# Patient Record
Sex: Female | Born: 1935 | Race: White | Hispanic: No | Marital: Married
Health system: Midwestern US, Community
[De-identification: ages and names within clinical notes are randomized; demographics above are authoritative.]

## PROBLEM LIST (undated history)

## (undated) DIAGNOSIS — R600 Localized edema: Secondary | ICD-10-CM

## (undated) DIAGNOSIS — N1832 Chronic kidney disease, stage 3b (HCC): Secondary | ICD-10-CM

## (undated) DIAGNOSIS — I129 Hypertensive chronic kidney disease with stage 1 through stage 4 chronic kidney disease, or unspecified chronic kidney disease: Secondary | ICD-10-CM

---

## 2010-02-18 LAB — CBC WITH AUTO DIFFERENTIAL
Basophils %: 0.5 % (ref 0.0–2.0)
Basophils Absolute: 0 10*3 (ref 0.0–0.2)
Eosinophils %: 1.4 % (ref 0.0–5.0)
Eosinophils Absolute: 0.1 10*3 (ref 0.0–0.6)
Granulocyte Absolute Count: 1.7 10*3 (ref 1.7–7.7)
Hematocrit: 35.9 % — ABNORMAL LOW (ref 36.0–48.0)
Hemoglobin: 12.4 g/dL (ref 12.0–16.0)
Lymphocytes %: 43.4 % — ABNORMAL HIGH (ref 25.0–40.0)
Lymphocytes Absolute: 1.6 10*3 (ref 1.0–5.1)
MCH: 32.1 pg (ref 26–34)
MCHC: 34.4 g/dL (ref 31–36)
MCV: 93.2 fl (ref 80–100)
MPV: 9.1 fl (ref 5.0–10.5)
Monocytes %: 9.7 %
Monocytes Absolute: 0.4 10*3
Platelets: 229 10*3 (ref 135–450)
RBC: 3.85 10*6 — ABNORMAL LOW (ref 4.0–5.2)
RDW: 13.6 % (ref 11.5–14.5)
Segs Relative: 45 % (ref 42.0–63.0)
WBC: 3.8 10*3 — ABNORMAL LOW (ref 4.0–11.0)

## 2010-02-18 LAB — COMPREHENSIVE METABOLIC PANEL
ALT: 19 U/L (ref 10–40)
AST: 28 U/L (ref 15–37)
Albumin/Globulin Ratio: 1.6 (ref 1.1–2.2)
Albumin: 4.4 g/dL (ref 3.4–5.0)
Alkaline Phosphatase: 61 U/L (ref 45–129)
BUN: 19 mg/dL — ABNORMAL HIGH (ref 7–18)
CO2: 29 meq/L (ref 21–32)
Calcium: 9.9 mg/dL (ref 8.3–10.6)
Chloride: 105 meq/L (ref 99–110)
Creatinine: 1 mg/dL (ref 0.6–1.2)
GFR Est, African/Amer: >60
GFR, Estimated: 57 — ABNORMAL LOW (ref >60)
Glucose: 82 mg/dL (ref 70–99)
Potassium: 5.2 meq/L — ABNORMAL HIGH (ref 3.5–5.1)
Sodium: 139 meq/L (ref 136–145)
Total Bilirubin: 0.3 mg/dL (ref 0.0–1.0)
Total Protein: 7.2 g/dL (ref 6.4–8.2)

## 2010-02-18 LAB — LIPID PANEL
Cholesterol, Total: 193 mg/dl (ref <200)
HDL: 78 mg/dL — ABNORMAL HIGH (ref 40–60)
LDL Calculated: 103 mg/dl — ABNORMAL HIGH (ref <100)
Triglycerides: 59 mg/dl (ref <150)
VLDL Cholesterol Calculated: 12 mg/dl

## 2010-02-19 LAB — TSH
T4 Free: 1.14 ng/dL (ref 0.9–1.8)
TSH: 4.66 u[IU]/mL (ref 0.35–5.5)

## 2010-02-19 LAB — VITAMIN D 25 HYDROXY: Vit D, 25-Hydroxy: 31 ng/mL (ref 30–80)

## 2010-02-19 LAB — T4, FREE: T4 Free: 1.14 ng/dL (ref 0.9–1.8)

## 2012-02-22 NOTE — Telephone Encounter (Signed)
NEEDS REFILL ON CRESTOR 10MG  CALLED TO [facility]

## 2012-02-22 NOTE — Telephone Encounter (Signed)
No refill ; not sreen recently ; appt in 4-6 wk if she wants on ; not sooner

## 2012-02-23 NOTE — Telephone Encounter (Signed)
Tried to call the patient but no answer.  Will try back later

## 2014-09-25 LAB — COMPREHENSIVE METABOLIC PANEL
ALT: 30 U/L (ref 10–40)
AST: 31 IU/L (ref 15–37)
Albumin: 3.5 GM/DL (ref 3.4–5.0)
Alkaline Phosphatase: 82 IU/L (ref 40–128)
Anion Gap: 11 (ref 4–16)
BUN: 42 MG/DL — ABNORMAL HIGH (ref 6–23)
CO2: 26 MMOL/L (ref 21–32)
Calcium: 8.9 MG/DL (ref 8.3–10.6)
Chloride: 103 mMol/L (ref 99–110)
Creatinine: 1.2 MG/DL — ABNORMAL HIGH (ref 0.6–1.1)
GFR African American: 52 mL/min/{1.73_m2}
GFR Non-African American: 43 mL/min/{1.73_m2}
Glucose, Fasting: 115 MG/DL — ABNORMAL HIGH (ref 70–99)
Potassium: 4.7 MMOL/L (ref 3.5–5.1)
Sodium: 140 MMOL/L (ref 136–145)
Total Bilirubin: 0.2 MG/DL (ref 0.0–1.0)
Total Protein: 5.9 GM/DL — ABNORMAL LOW (ref 6.4–8.2)

## 2014-09-25 LAB — CBC WITH AUTO DIFFERENTIAL
Basophils %: 0.3 % (ref 0–1)
Basophils Absolute: 0 10*3/uL
Eosinophils %: 3.7 % — ABNORMAL HIGH (ref 0–3)
Eosinophils Absolute: 0.3 10*3/uL
Hematocrit: 36.3 % — ABNORMAL LOW (ref 37–47)
Hemoglobin: 11.9 GM/DL — ABNORMAL LOW (ref 12.5–16.0)
Lymphocytes %: 19.5 % — ABNORMAL LOW (ref 24–44)
Lymphocytes Absolute: 1.3 10*3/uL
MCH: 30.3 PG (ref 27–31)
MCHC: 32.7 % (ref 32.0–36.0)
MCV: 92.5 FL (ref 78–100)
MPV: 9.8 FL (ref 7.5–11.1)
Monocytes %: 11.8 % — ABNORMAL HIGH (ref 0–4)
Monocytes Absolute: 0.8 10*3/uL
Platelets: 183 10*3/uL (ref 140–440)
RBC: 3.93 10*6/uL — ABNORMAL LOW (ref 4.2–5.4)
RDW: 14.2 % (ref 11.7–14.9)
Segs Absolute: 4.4 10*3/uL
Segs Relative: 64.7 % (ref 36–66)
WBC: 6.8 10*3/uL (ref 4.0–10.5)

## 2014-09-25 LAB — MAGNESIUM: Magnesium: 2.1 mg/dl (ref 1.8–2.4)

## 2014-09-25 LAB — TSH: TSH, High Sensitivity: 1.04 u[IU]/mL (ref 0.270–4.20)

## 2015-05-17 NOTE — Telephone Encounter (Deleted)
Clearance letter faxed to [employer] Attn Ddr. Lance SellE. Mitchel Opremcak. [phone]  Phone [phone]

## 2021-06-09 NOTE — Telephone Encounter (Signed)
Received call from [facility] at [facility] with Red Flag Complaint.    Subjective: Caller, daughter with pt on the line, states "her ankles have been swollen for a long time, a few months and have seem to be swelling some more. Her other doctor had her go to [place] to make sure she didn't have blood clots which she didn't. Her two big toes are black for a long long time and I don't know if the doctor knows that because we didn't take her shoes off."     Current Symptoms: great toe bilateral feet. Toe nail is thick. Bilateral ankle edema up to her knees. +pitting. Chronic both conditions. Edema equal. No injury to legs. Uses cane and walker.     Onset: several months ago; worsening    Associated Symptoms: denies decreased sensation to feet    Pain Severity: 0/10; ;     Temperature: denies     What has been tried: minimal elevation, walking, compression stockings. Plan to use Epson Salts.     LMP: NA Pregnant: NA    Recommended disposition: See in Office Today. Pt and family informed if unable to get in with provider to go to urgent care or walk in clinic. Agreeable.    Care advice provided, patient verbalizes understanding; denies any other questions or concerns; instructed to call back for any new or worsening symptoms.    Patient/Caller agrees with recommended disposition; Clinical research associate provided warm transfer to [employer] at [employer] [facility] for appointment scheduling     Attention Provider:  Thank you for allowing me to participate in the care of your patient.  The patient was connected to triage in response to information provided to the ECC/PSC.  Please do not respond through this encounter as the response is not directed to a shared pool.          Reason for Disposition  ??? MODERATE swelling of both ankles (e.g., swelling extends up to the knees) AND new-onset or worsening    Protocols used: LEG SWELLING AND EDEMA-ADULT-OH

## 2021-06-12 ENCOUNTER — Ambulatory Visit: Admit: 2021-06-12 | Discharge: 2021-06-12 | Payer: MEDICARE | Attending: Family Medicine | Primary: Family Medicine

## 2021-06-12 ENCOUNTER — Encounter

## 2021-06-12 DIAGNOSIS — Z7689 Persons encountering health services in other specified circumstances: Secondary | ICD-10-CM

## 2021-06-12 LAB — RENAL FUNCTION PANEL
Albumin: 4.5 g/dL (ref 3.4–5.0)
Anion Gap: 13 (ref 3–16)
BUN: 27 mg/dL — ABNORMAL HIGH (ref 7–20)
CO2: 26 mmol/L (ref 21–32)
Calcium: 10 mg/dL (ref 8.3–10.6)
Chloride: 100 mmol/L (ref 99–110)
Creatinine: 1.3 mg/dL — ABNORMAL HIGH (ref 0.6–1.2)
GFR African American: 46 — AB (ref >60)
GFR Non-African American: 38 — AB (ref >60)
Glucose: 96 mg/dL (ref 70–99)
Phosphorus: 4 mg/dL (ref 2.5–4.9)
Potassium: 4.3 mmol/L (ref 3.5–5.1)
Sodium: 139 mmol/L (ref 136–145)

## 2021-06-12 LAB — URINALYSIS
Bilirubin Urine: NEGATIVE
Blood, Urine: NEGATIVE
Glucose, Ur: NEGATIVE mg/dL
Ketones, Urine: NEGATIVE mg/dL
Nitrite, Urine: NEGATIVE
Protein, UA: NEGATIVE mg/dL
Specific Gravity, UA: 1.013 (ref 1.005–1.030)
Urobilinogen, Urine: 0.2 E.U./dL (ref <2.0)
pH, UA: 6.5 (ref 5.0–8.0)

## 2021-06-12 LAB — MICROSCOPIC URINALYSIS
Bacteria, UA: NONE SEEN /HPF
Epithelial Cells, UA: 0 /HPF (ref 0–5)
Hyaline Casts, UA: 0 /LPF (ref 0–8)
RBC, UA: 1 /HPF (ref 0–4)
WBC, UA: 4 /HPF (ref 0–5)

## 2021-06-12 LAB — HEPATIC FUNCTION PANEL
ALT: 13 U/L (ref 10–40)
AST: 23 U/L (ref 15–37)
Alkaline Phosphatase: 83 U/L (ref 40–129)
Bilirubin, Direct: <0.2 mg/dL (ref 0.0–0.3)
Total Bilirubin: 0.4 mg/dL (ref 0.0–1.0)
Total Protein: 7.4 g/dL (ref 6.4–8.2)

## 2021-06-12 MED ORDER — TERBINAFINE HCL 250 MG PO TABS
250 MG | ORAL_TABLET | Freq: Every day | ORAL | 0 refills | Status: AC
Start: 2021-06-12 — End: 2021-09-04

## 2021-06-12 MED ORDER — AMLODIPINE BESYLATE 5 MG PO TABS
5 MG | ORAL_TABLET | Freq: Every day | ORAL | 1 refills | Status: DC
Start: 2021-06-12 — End: 2021-10-15

## 2021-06-12 NOTE — Progress Notes (Signed)
Janice Cunningham   87 y.o. female   1926/03/21    This is patient's first visit with me.  Janice Cunningham is here to establish care as their new PCP.    -Former PCP - Dr. Burney Gauze. Janice Cunningham.    Janice Cunningham has a PMH significant for:    Patient Active Problem List   Diagnosis   ??? Essential hypertension   ??? Acne rosacea   ??? Onychomycosis of great toe, bilateral   ??? Bilateral leg edema       Reason(s) for visit:   Chief Complaint   Patient presents with   ??? Established New Doctor   ??? Nail Problem   ??? Foot Swelling     Bilateral ankle       HPI:    Chart review:  -Patient went to [facility] ER on 02/26/2021.  She was brought in by EMS for head trauma 2/2 fall.  Denied LOC.  She reported that she had "lost her footing."  CT head w/o contrast showed no acute intracranial abnormalities.  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  Labs were remarkable for H/H 12.0/34.9, K 3.3, glucose 123, BUN 34, Cr 1.5, eGFR 32. UA showed signs of UTI.  Urine culture was positive for E coli.  She was prescribed Cephalexin.    Office visit encounter:    Patient was accompanied by her daughter during her office visit with me.    Patient has history of HTN.  No hx of MI or issues with recurrent chest pain, SOB, palpitations, etc.  She has no hx of TIA/CVA.  She's currently taking Amlodipine for at least the past several months.  She stated that her BP fluctuates and is sometimes as low as 90/60's or as high as SBP 140's or DBP 90's.      She stated that she has had issues of BLE edema over the past several months and is wearing pantyhose.  She denied issues with claudication and persistent cold extremities.      She also reported of issues of thick discolored toenails, especially of her both of her big toes.  She stated that this has been there for years.  She stated that she did  not discuss this issue with her previous PCP and just ignored the problem.  Patient's daughter stated that she recently became aware of this and wanted an evaluation of her mother's feet.  Patient has never seen a podiatrist for this issue.    Patient also has PMH of acne rosacea.  She currently does not have this issue right now.  She stated that her former PCP has prescribed Doxycycline in the past and is currently taking it right now.  She stated that she has tried various rx topical agents, but she could not recall the names of them.    PDMP monitoring:  -Revealed no controlled medications written of any kind.    -Last report:   Last PDMP Janice Cunningham as Reviewed Janice Cunningham):  Review User Review Instant Review Result   Janice Cunningham, Janice Cunningham 06/10/2021 11:46 AM Reviewed PDMP [1]       Allergies   Allergen Reactions   ??? Penicillins Rash       Current Outpatient Medications on File Prior to Visit   Medication Sig Dispense Refill   ??? furosemide (LASIX) 20 MG tablet Take 20 mg by mouth  daily       No current facility-administered medications on file prior to visit.        Family History   Problem Relation Age of Onset   ??? Ovarian Cancer Mother    ??? Heart Attack Sister    ??? Dementia Sister    ??? Dementia Brother        Social History     Tobacco Use   ??? Smoking status: Never Smoker   ??? Smokeless tobacco: Never Used   Substance Use Topics   ??? Alcohol use: Yes        Lab Results   Component Value Date    WBC 6.8 09/25/2014    HGB 11.9 (L) 09/25/2014    HCT 36.3 (L) 09/25/2014    MCV 92.5 09/25/2014    PLT 183 09/25/2014         Chemistry        Component Value Date/Time    NA 139 06/12/2021 1040    K 4.3 06/12/2021 1040    CL 100 06/12/2021 1040    CO2 26 06/12/2021 1040    BUN 27 (H) 06/12/2021 1040    CREATININE 1.3 (H) 06/12/2021 1040        Component Value Date/Time    CALCIUM 10.0 06/12/2021 1040    ALKPHOS 83 06/12/2021 1040    AST 23 06/12/2021 1040    ALT 13 06/12/2021 1040    BILITOT 0.4 06/12/2021 1040          Lab Results    Component Value Date    ALT 13 06/12/2021    AST 23 06/12/2021    ALKPHOS 83 06/12/2021    BILITOT 0.4 06/12/2021       Review of Systems   Constitutional: Negative for activity change, appetite change, fatigue, fever and unexpected weight change.   HENT: Negative for congestion, rhinorrhea, sinus pressure and trouble swallowing.    Respiratory: Negative for cough, chest tightness, shortness of breath and wheezing.    Cardiovascular: Negative for chest pain, palpitations and leg swelling.   Gastrointestinal: Negative for abdominal distention, abdominal pain, blood in stool, constipation, diarrhea, nausea and vomiting.   Genitourinary: Negative for dysuria, frequency and hematuria.   Musculoskeletal: Positive for joint swelling. Negative for arthralgias and back pain.   Skin: Negative for rash.   Neurological: Negative for dizziness, weakness, light-headedness, numbness and headaches.   Psychiatric/Behavioral: Negative for agitation, dysphoric mood and sleep disturbance. The patient is not nervous/anxious.        Wt Readings from Last 3 Encounters:   06/12/21 109 lb (49.4 kg)       BP Readings from Last 3 Encounters:   06/12/21 98/60       Pulse Readings from Last 3 Encounters:   06/12/21 78       BP 98/60    Pulse 78    Temp 97.9 ??F (36.6 ??C)    Ht '5\' 6"'  (1.676 m)    Wt 109 lb (49.4 kg)    SpO2 100%    BMI 17.59 kg/m??      Physical Exam  Vitals reviewed.   Constitutional:       General: She is awake. She is not in acute distress.     Appearance: She is underweight. She is not ill-appearing or diaphoretic.   HENT:      Head: Normocephalic and atraumatic. No abrasion or masses. Hair is normal.      Right Ear: External ear normal.  Left Ear: External ear normal.      Nose: Nose normal.   Eyes:      General: Lids are normal. Gaze aligned appropriately. No scleral icterus.        Right eye: No discharge.         Left eye: No discharge.      Extraocular Movements: Extraocular movements intact.       Conjunctiva/sclera: Conjunctivae normal.   Neck:      Trachea: Phonation normal.   Cardiovascular:      Rate and Rhythm: Normal rate and regular rhythm.      Pulses:           Dorsalis pedis pulses are 2+ on the right side and 2+ on the left side.        Posterior tibial pulses are 1+ on the right side and 1+ on the left side.   Pulmonary:      Effort: Pulmonary effort is normal. No respiratory distress.      Breath sounds: No wheezing, rhonchi or rales.   Abdominal:      General: Abdomen is flat. There is no distension.      Palpations: Abdomen is soft.   Musculoskeletal:         General: No deformity. Normal range of motion.      Cervical back: Normal range of motion. No erythema.      Right lower leg: Edema present.      Left lower leg: Edema present.   Feet:      Right foot:      Toenail Condition: Right toenails are abnormally thick. Fungal disease present.     Left foot:      Toenail Condition: Left toenails are abnormally thick. Fungal disease present.  Skin:     Coloration: Skin is not cyanotic, jaundiced or pale.      Findings: No abrasion, abscess, bruising, ecchymosis, erythema, signs of injury, laceration, lesion, petechiae, rash or wound.   Neurological:      General: No focal deficit present.      Mental Status: She is alert. Mental status is at baseline.      GCS: GCS eye subscore is 4. GCS verbal subscore is 5. GCS motor subscore is 6.      Cranial Nerves: No cranial nerve deficit, dysarthria or facial asymmetry.      Motor: No weakness, tremor, atrophy or seizure activity.      Coordination: Coordination normal.      Gait: Gait is intact.   Psychiatric:         Attention and Perception: Attention and perception normal.         Mood and Affect: Mood and affect normal.         Speech: Speech normal.         Behavior: Behavior normal. Behavior is cooperative.         Thought Content: Thought content normal.         Cognition and Memory: Cognition normal.         Judgment: Judgment normal.        Assessment/Plan:   Janice Cunningham was seen today for established new doctor, nail problem and foot swelling.    Diagnoses and all orders for this visit:    Encounter to establish care with new doctor    Essential hypertension  Comments:  BP is low normal. However, home readings are very volatile. Low salt diet.  Orders:  -     amLODIPine (  NORVASC) 5 MG tablet; Take 1 tablet by mouth daily    Bilateral leg edema  Comments:  Likely related to combination of CCB and high salt diet. Will lower dose of Amlodipine. Recommended low salt diet. Compression stockings.  Orders:  -     Urinalysis; Future    Onychomycosis of great toe, bilateral  Comments:  Nails are very thick and dystrophic. I discussed with her that oral anti-fungal therapy alone will not correct the shape of the nail.  Orders:  -     AFL - Fellner, Celeste K, DPM, Podiatry, [place]  -     terbinafine (LAMISIL) 250 MG tablet; Take 1 tablet by mouth daily  -     Hepatic Function Panel; Future  -     Renal Function Panel; Future    Dystrophic nail  -     AFL - Fellner, Celeste K, DPM, Podiatry, [place]    Underweight  Comments:  This could be her baseline. Will observe weight.    Acne rosacea  Comments:  No signs of acne rosacea. Recommended to stop Doxycycline for now.    At high risk for falls  Comments:  Recommended to continue using her cane.      Amb Fall Risk Assessment and TUG Test 06/12/2021   Do you feel unsteady or are you worried about falling?  no   2 or more falls in past year? yes   Fall with injury in past year? no       I reviewed the plan of care with the patient. Patient acknowledged understanding and agreed with plan of care overall.    Medications Discontinued During This Encounter   Medication Reason   ??? atorvastatin (LIPITOR) 10 MG tablet LIST CLEANUP   ??? calcium carb-cholecalciferol 250-125 MG-UNIT TABS LIST CLEANUP   ??? erythromycin (ERY-CAP) 250 MG CPEP extended release capsule LIST CLEANUP   ??? amLODIPine (NORVASC) 10 MG tablet  DOSE ADJUSTMENT   ??? DOXYCYCLINE MONOHYDRATE PO Stop Taking at Discharge        General information on medications:  -When it comes to medications, whether with starting or adding a new medication or increasing the dose of a current medication, the benefits and risks have to always be considered and weighed over, especially if one is taking other medications as well.   -There are no medications that have no side effects and that there is always a risk involved with taking a medication.    -If a side effect were to occur with starting a new medication or with increasing the dose of a current medication that either the medication can be totally discontinued altogether or simply decrease the dose of it and if this would be the case a follow-up appointment would be deemed necessary.    -The drug allergy list will then be updated with the corresponding side effect(s) if it's deemed to be a true 'drug allergy'.    -The most common adverse effects of medication(s) were addressed at today's visit.    -Lastly, the coverage status of a medication may vary from insurance to insurance and the only way to verify if the medication is covered is to send an actual prescription in.    -The drug formulary of each insurance changes without any warning or notification to the healthcare provider let alone the pharmacy.  -The cost of medications vary from insurance to insurance and the cost is always subject to change just like the drug formulary.    Follow-up: Return  in about 4 weeks (around 07/10/2021) for HTN..     Patient was informed that if his or her symptoms worsen to follow up with me sooner or go to the nearest ER if the symptoms are very significant and warrant higher level of care.    Regarding my note:  -This note was composed (by me only and not with assistance via a scribe) to the best of my knowledge and recollection of the encounter with the patient using one of my own customized note templates utilizing a combination of  typing and dictating with the [employer] speech recognition software.  As a result, the note may possibly contain various errors (e.g. spelling, grammar, and non-sensible words/phrases/statements) despite reviewing the note prior to signing it for completion.      Time spent includes some or all of the following, both face-to-face time and non face-to-face time, but is not limited to:  '[x]'  Preparing to see the patient by reviewing medical records available (notes, labs, imaging, etc.) prior to seeing the patient.  '[x]'  Obtaining and/or reviewing the history from the patient.  '[x]'  Performing a medically appropriate examination.  '[x]'  Ordering of relevant lab work, medications, referrals, or procedures.  '[x]'  Discussing patient's medical issues and formulating an assessment and plan.   '[x]'  Reviewing plan of care with patient.  Answering any questions or concerns.   '[x]'  Documentation within the electronic health record (EHR)  '[]'  Reviewing records of history relevant to patient's issues after seeing the patient.  '[]'  Discussion or coordination of care with other health care professionals  '[x]'  Other: length of office visit:  minutes    Anis Cinelli P. Miguel Aschoff M.D.  [place]    Electronically signed by Katheren Shams, MD on 06/13/2021 at 10:00 PM.

## 2021-06-12 NOTE — Patient Instructions (Signed)
Watch sodium intake    Elevate legs    Minimize prolonged standing/sitting

## 2021-07-10 ENCOUNTER — Ambulatory Visit: Admit: 2021-07-10 | Discharge: 2021-07-10 | Payer: MEDICARE | Attending: Family Medicine | Primary: Family Medicine

## 2021-07-10 ENCOUNTER — Encounter

## 2021-07-10 DIAGNOSIS — I129 Hypertensive chronic kidney disease with stage 1 through stage 4 chronic kidney disease, or unspecified chronic kidney disease: Secondary | ICD-10-CM

## 2021-07-10 DIAGNOSIS — N1832 Chronic kidney disease, stage 3b: Secondary | ICD-10-CM

## 2021-07-10 NOTE — Progress Notes (Addendum)
Janice Cunningham   87 y.o. female   [birth year]    HPI:    Patient was last seen by me on 06/12/2021.  During our last office visit:   -This was her first visit with me to establish care as her new PCP.  -She was found to have onychomycosis of both feet. I prescribed her terbinafine and also referred to a podiatrist given how extensive it was.    Reason(s) for visit:   Chief Complaint   Patient presents with    Hypertension    Tinea Pedis     Patient was accompanied by her daughter Rosey Bath(Teresa).  She stated [place] her mom.    Patient reported to me also of issues of BLE edema for the past several months.  I recommended lowering her Amlodipine from 10 to 5 mg once daily.  She actually stopped taking her Amlodipine and her swelling of legs is slightly better, especially around her ankles.    Patient stated that she saw the podiatrist and had her toe nails from her great toe removed. No notes were forwarded. No medications were prescribed. She was advised to f/u with me regarding management of her onychomycosis.    Allergies   Allergen Reactions    Penicillins Rash       Current Outpatient Medications on File Prior to Visit   Medication Sig Dispense Refill    terbinafine (LAMISIL) 250 MG tablet Take 1 tablet by mouth daily 84 tablet 0    amLODIPine (NORVASC) 5 MG tablet Take 1 tablet by mouth daily (Patient taking differently: Take 5 mg by mouth as needed) 90 tablet 1    furosemide (LASIX) 20 MG tablet Take 20 mg by mouth daily (Patient not taking: Reported on 07/10/2021)       No current facility-administered medications on file prior to visit.        Family History   Problem Relation Age of Onset    Ovarian Cancer Mother     Heart Attack Sister     Dementia Sister     Dementia Brother        Social History     Tobacco Use    Smoking status: Never    Smokeless tobacco: Never   Substance Use Topics    Alcohol use: Yes        Lab Results   Component Value Date    WBC 6.8  09/25/2014    HGB 11.9 (L) 09/25/2014    HCT 36.3 (L) 09/25/2014    MCV 92.5 09/25/2014    PLT 183 09/25/2014         Chemistry        Component Value Date/Time    NA 139 06/12/2021 1040    K 4.3 06/12/2021 1040    CL 100 06/12/2021 1040    CO2 26 06/12/2021 1040    BUN 27 (H) 06/12/2021 1040    CREATININE 1.3 (H) 06/12/2021 1040        Component Value Date/Time    CALCIUM 10.0 06/12/2021 1040    ALKPHOS 83 06/12/2021 1040    AST 23 06/12/2021 1040    ALT 13 06/12/2021 1040    BILITOT 0.4 06/12/2021 1040          Lab Results   Component Value Date    ALT 13 06/12/2021    AST 23 06/12/2021    ALKPHOS 83 06/12/2021    BILITOT 0.4  06/12/2021       Review of Systems   Constitutional:  Negative for activity change, appetite change, fatigue, fever and unexpected weight change.   HENT:  Negative for congestion, rhinorrhea, sinus pressure and trouble swallowing.    Respiratory:  Negative for cough, chest tightness, shortness of breath and wheezing.    Cardiovascular:  Negative for chest pain, palpitations and leg swelling.   Gastrointestinal:  Negative for abdominal distention, abdominal pain, blood in stool, constipation, diarrhea, nausea and vomiting.   Genitourinary:  Negative for dysuria, frequency and hematuria.   Musculoskeletal:  Positive for joint swelling. Negative for arthralgias and back pain.   Skin:  Negative for rash.   Neurological:  Negative for dizziness, weakness, light-headedness, numbness and headaches.   Psychiatric/Behavioral:  Negative for sleep disturbance.         Wt Readings from Last 3 Encounters:   07/10/21 112 lb 6.4 oz (51 kg)   06/12/21 109 lb (49.4 kg)       BP Readings from Last 3 Encounters:   07/10/21 110/62   06/12/21 98/60       Pulse Readings from Last 3 Encounters:   07/10/21 85   06/12/21 78       BP 110/62    Pulse 85    Temp 97.3 ??F (36.3 ??C)    Wt 112 lb 6.4 oz (51 kg)    SpO2 99%    BMI 18.14 kg/m??      Physical Exam  Vitals reviewed.   Constitutional:       General: She is awake.  She is not in acute distress.     Appearance: She is not ill-appearing or diaphoretic.   HENT:      Head: Normocephalic and atraumatic. No abrasion or masses. Hair is normal.      Right Ear: External ear normal.      Left Ear: External ear normal.      Nose: Nose normal.   Eyes:      General: Lids are normal. Gaze aligned appropriately. No scleral icterus.        Right eye: No discharge.         Left eye: No discharge.      Extraocular Movements: Extraocular movements intact.      Conjunctiva/sclera: Conjunctivae normal.   Neck:      Trachea: Phonation normal.   Cardiovascular:      Rate and Rhythm: Normal rate and regular rhythm.      Pulses:           Dorsalis pedis pulses are 2+ on the right side and 2+ on the left side.        Posterior tibial pulses are 2+ on the right side and 2+ on the left side.   Pulmonary:      Effort: Pulmonary effort is normal. No respiratory distress.      Breath sounds: No wheezing, rhonchi or rales.   Abdominal:      General: Abdomen is flat. There is no distension.      Palpations: Abdomen is soft.   Musculoskeletal:         General: No deformity. Normal range of motion.      Cervical back: Normal range of motion. No erythema.      Right lower leg: 2+ Pitting Edema present.      Left lower leg: 2+ Pitting Edema present.      Right foot: Normal range of motion. No deformity, bunion, Charcot foot, foot  drop or prominent metatarsal heads.      Left foot: Normal range of motion. No deformity, bunion, Charcot foot, foot drop or prominent metatarsal heads.   Feet:      Right foot:      Skin integrity: Dry skin present.      Toenail Condition: Fungal disease present.     Left foot:      Skin integrity: Dry skin present.      Toenail Condition: Fungal disease present.  Skin:     Coloration: Skin is not cyanotic, jaundiced or pale.      Findings: No abrasion, abscess, bruising, ecchymosis, erythema, signs of injury, laceration, lesion, petechiae, rash or wound.   Neurological:      General:  No focal deficit present.      Mental Status: She is alert. Mental status is at baseline.      GCS: GCS eye subscore is 4. GCS verbal subscore is 5. GCS motor subscore is 6.      Cranial Nerves: No cranial nerve deficit, dysarthria or facial asymmetry.      Motor: No weakness, tremor, atrophy or seizure activity.      Coordination: Coordination normal.      Gait: Gait is intact.   Psychiatric:         Attention and Perception: Attention and perception normal.         Mood and Affect: Mood and affect normal.         Speech: Speech normal.         Behavior: Behavior normal. Behavior is cooperative.         Thought Content: Thought content normal.     Assessment/Plan:   Cortney was seen today for hypertension and tinea pedis.    Diagnoses and all orders for this visit:    Benign hypertension with stage 3b chronic kidney disease (HCC)  Comments:  Monitor BP. Advised to restart if BP is SBP > 150/90.  Orders:  -     Renal Function Panel; Future  -     TSH; Future  -     T4, Free; Future    Bilateral leg edema  Comments:  Advised to use compression stockings, elevate BLE, and minimize being sedentary. Low salt diet.  Orders:  -     TSH; Future  -     T4, Free; Future    Onychomycosis of great toe  Comments:  Will continue Terbinafine for 12 weeks.    Dystrophic nail  Comments:  Reassured patient that it may take time for this to grow back. Advised to f/u with podiatrist.    Encounter for medication monitoring  Comments:  Will repeat LFT given that she's on oral Terbinafine.  Orders:  -     Hepatic Function Panel; Future    I reviewed the plan of care with the patient. Patient acknowledged understanding and agreed with plan of care overall.    There are no discontinued medications.     General information on medications:  -When it comes to medications, whether with starting or adding a new medication or increasing the dose of a current medication, the benefits and risks have to always be considered and weighed over,  especially if one is taking other medications as well.   -There are no medications that have no side effects and that there is always a risk involved with taking a medication.    -If a side effect were to occur with starting a new medication or  with increasing the dose of a current medication that either the medication can be totally discontinued altogether or simply decrease the dose of it and if this would be the case a follow-up appointment would be deemed necessary.    -The drug allergy list will then be updated with the corresponding side effect(s) if it's deemed to be a true 'drug allergy'.    -The most common adverse effects of medication(s) were addressed at today's visit.    -Lastly, the coverage status of a medication may vary from insurance to insurance and the only way to verify if the medication is covered is to send an actual prescription in.    -The drug formulary of each insurance changes without any warning or notification to the healthcare provider let alone the pharmacy.  -The cost of medications vary from insurance to insurance and the cost is always subject to change just like the drug formulary.    Follow-up: Return in about 3 months (around 10/10/2021) for AWV.Marland Kitchen     Patient was informed that if his or her symptoms worsen to follow up with me sooner or go to the nearest ER if the symptoms are very significant and warrant higher level of care.    Regarding my note:  -This note was composed (by me only and not with assistance via a scribe) to the best of my knowledge and recollection of the encounter with the patient using one of my own customized note templates utilizing a combination of typing and dictating with the [employer] medical speech recognition software.  As a result, the note may possibly contain various errors (e.g. spelling, grammar, and non-sensible words/phrases/statements) despite reviewing the note prior to signing it for completion.      Time spent includes some  or all of the following, both face-to-face time and non face-to-face time, but is not limited to:  [x]  Preparing to see the patient by reviewing medical records available (notes, labs, imaging, etc.) prior to seeing the patient.  [x]  Obtaining and/or reviewing the history from the patient.  [x]  Performing a medically appropriate examination.  [x]  Ordering of relevant lab work, medications, referrals, or procedures.  [x]  Discussing patient's medical issues and formulating an assessment and plan.   [x]  Reviewing plan of care with patient.  Answering any questions or concerns.   [x]  Documentation within the electronic health record (EHR)  []  Reviewing records of history relevant to patient's issues after seeing the patient.  []  Discussion or coordination of care with other health care professionals  [x]  Other: length office visit - 30 minutes.  -I spent a significant amount of time discussing various issues as noted above and also with formulating a treatment plan for each specific issue. Patient was given the opportunity to ask me any questions and address any concerns/issues. I also reviewed lab work (if available) as well as prior notes from PCP and/or other specialists if available.     Talbot Monarch P. M.D.  Family Medicine  [facility] - Family Medicine -    Electronically signed by , MD M.D. on 07/10/2021 at 12:41 PM.

## 2021-07-11 LAB — HEPATIC FUNCTION PANEL
ALT: 14 U/L (ref 10–40)
AST: 21 U/L (ref 15–37)
Alkaline Phosphatase: 85 U/L (ref 40–129)
Bilirubin, Direct: <0.2 mg/dL (ref 0.0–0.3)
Total Bilirubin: 0.3 mg/dL (ref 0.0–1.0)
Total Protein: 7.1 g/dL (ref 6.4–8.2)

## 2021-07-11 LAB — RENAL FUNCTION PANEL
Albumin: 4.4 g/dL (ref 3.4–5.0)
Anion Gap: 11 (ref 3–16)
BUN: 26 mg/dL — ABNORMAL HIGH (ref 7–20)
CO2: 26 mmol/L (ref 21–32)
Calcium: 9.6 mg/dL (ref 8.3–10.6)
Chloride: 103 mmol/L (ref 99–110)
Creatinine: 1.2 mg/dL (ref 0.6–1.2)
GFR African American: 51 — AB (ref >60)
GFR Non-African American: 42 — AB (ref >60)
Glucose: 66 mg/dL — ABNORMAL LOW (ref 70–99)
Phosphorus: 4.3 mg/dL (ref 2.5–4.9)
Potassium: 4.9 mmol/L (ref 3.5–5.1)
Sodium: 140 mmol/L (ref 136–145)

## 2021-07-11 LAB — T4, FREE: T4 Free: 1.2 ng/dL (ref 0.9–1.8)

## 2021-07-11 LAB — TSH: TSH: 3.26 u[IU]/mL (ref 0.27–4.20)

## 2021-10-06 NOTE — Telephone Encounter (Signed)
Spoke with pt's daughter, Aggie Cosier. Aggie Cosier stated that her mother has a toenail fungus and pt is "not allowing" them to soak or take care of her feet.    Aggie Cosier stated she wanted PCP to be aware of this for appt on 11/2. Aggie Cosier stated that pt had daughter cancel podiatry appt and that she "does not want to go" but Aggie Cosier is hoping pt will listen to Dr. Burna Mortimer.    Aggie Cosier stated that her sister is living with pt and pt does not use home care services at this time.    Aggie Cosier did not want to bring this up in front of pt at appt but wanted PCP to know.

## 2021-10-06 NOTE — Telephone Encounter (Signed)
-----   Message from [place] sent at 10/06/2021 10:22 AM EDT -----  Subject: Message to Provider    QUESTIONS  Information for Provider? DAUGHTER IS ASKING FOR SOMEONE OR DOCTO COULD   REACH OUT TO HER .She would like to speak with someone in her mom concern.   DAUGHTER IS ASKING FOR A CALL DIRECT TO HER   ---------------------------------------------------------------------------  --------------  Cleotis Lema INFO  [phone]; OK to leave message on voicemail  ---------------------------------------------------------------------------  --------------  SCRIPT ANSWERS  Relationship to Patient? Other  Representative Name? THERESA   Is the Representative on the appropriate HIPAA document in Epic? Yes

## 2021-10-09 ENCOUNTER — Encounter: Attending: Family Medicine | Primary: Family Medicine

## 2021-10-12 NOTE — Telephone Encounter (Signed)
Noted. Thank you for attending to this.    Jasmina Gendron P. Bassem Bernasconi MD  Family Medicine   [facility] - Family Medicine - [place]

## 2021-10-15 ENCOUNTER — Ambulatory Visit: Admit: 2021-10-15 | Discharge: 2021-10-15 | Payer: MEDICARE | Attending: Family Medicine | Primary: Family Medicine

## 2021-10-15 DIAGNOSIS — N1832 Chronic kidney disease, stage 3b: Secondary | ICD-10-CM

## 2021-10-15 DIAGNOSIS — I129 Hypertensive chronic kidney disease with stage 1 through stage 4 chronic kidney disease, or unspecified chronic kidney disease: Secondary | ICD-10-CM

## 2021-10-15 MED ORDER — TERBINAFINE HCL 250 MG PO TABS
250 MG | ORAL_TABLET | Freq: Every day | ORAL | 0 refills | Status: AC
Start: 2021-10-15 — End: 2022-01-07

## 2021-10-15 MED ORDER — AMLODIPINE BESYLATE 5 MG PO TABS
5 MG | ORAL_TABLET | Freq: Every day | ORAL | 1 refills | Status: AC
Start: 2021-10-15 — End: ?

## 2021-10-15 NOTE — Progress Notes (Signed)
Janice Cunningham   87 y.o. female   Nov 30, 1926    HPI:    Patient was last seen by me on 07/10/2021.      Reason(s) for visit:   Chief Complaint   Patient presents with    Follow-up    Nail Problem     Patient was accompanied by her daughter today.  Daughter informed me that patient's blood pressure dropped down for a bit and she was off of amlodipine for 1.5 months.  Patient's daughter stated that her sister is usually when he checks her blood pressure and her blood pressure started creeping back up to SBP 150s and has restarted amlodipine 5 mg once daily has been consistently taking it for the past 2 months.  Patient denied any issues with frequent headache, visual disturbance, chest pain, shortness of breath, palpitations, dizziness, etc.  She also denied any issues with development of bilateral leg edema since restarting amlodipine.  She has taken furosemide as needed for bilateral lower leg edema and has not taken it in some time.    Patient's daughter verbalizes concern about her mother's issues with her feet.  The patient originally established care with a podiatrist for her onychomycosis.  She had both her toenails of her great toes removed.  She completed a 12-week course of oral terbinafine, which was where she started in late Rosann 2022.    Patient went off Amlopdine for 1.5 mo ago     BP crept up and went up to 150's    Taking Amlodipine now consistently for past 2 months.    Foot exam:    Dry skin      Reommneded prevnar-20    Allergies   Allergen Reactions    Penicillins Rash       Current Outpatient Medications on File Prior to Visit   Medication Sig Dispense Refill    furosemide (LASIX) 20 MG tablet Take 20 mg by mouth daily (Patient not taking: No sig reported)       No current facility-administered medications on file prior to visit.        Family History   Problem Relation Age of Onset    Ovarian Cancer Mother     Heart Attack Sister     Dementia Sister     Dementia Brother        Social History      Tobacco Use    Smoking status: Never    Smokeless tobacco: Never   Substance Use Topics    Alcohol use: Yes        Lab Results   Component Value Date    WBC 6.8 09/25/2014    HGB 11.9 (L) 09/25/2014    HCT 36.3 (L) 09/25/2014    MCV 92.5 09/25/2014    PLT 183 09/25/2014         Chemistry        Component Value Date/Time    NA 140 07/10/2021 1318    K 4.9 07/10/2021 1318    CL 103 07/10/2021 1318    CO2 26 07/10/2021 1318    BUN 26 (H) 07/10/2021 1318    CREATININE 1.2 07/10/2021 1318        Component Value Date/Time    CALCIUM 9.6 07/10/2021 1318    ALKPHOS 85 07/10/2021 1318    AST 21 07/10/2021 1318    ALT 14 07/10/2021 1318    BILITOT 0.3 07/10/2021 1318          Lab Results  Component Value Date    ALT 14 07/10/2021    AST 21 07/10/2021    ALKPHOS 85 07/10/2021    BILITOT 0.3 07/10/2021       Lab Results   Component Value Date    CHOL 193 02/18/2010     Lab Results   Component Value Date    TRIG 59 02/18/2010     Lab Results   Component Value Date    HDL 78 (H) 02/18/2010     Lab Results   Component Value Date    LDLCALC 103 (H) 02/18/2010     Lab Results   Component Value Date    LABVLDL 12 02/18/2010     No results found for: CHOLHDLRATIO      Review of Systems       Wt Readings from Last 3 Encounters:   10/15/21 115 lb (52.2 kg)   07/10/21 112 lb 6.4 oz (51 kg)   06/12/21 109 lb (49.4 kg)       BP Readings from Last 3 Encounters:   10/15/21 118/76   07/10/21 110/62   06/12/21 98/60       Pulse Readings from Last 3 Encounters:   10/15/21 77   07/10/21 85   06/12/21 78       BP 118/76    Pulse 77    Temp 97.2 ??F (36.2 ??C)    Wt 115 lb (52.2 kg)    SpO2 97%    BMI 18.56 kg/m??      Physical Exam  Vitals reviewed.   Constitutional:       General: She is awake. She is not in acute distress.     Appearance: She is not ill-appearing or diaphoretic.   HENT:      Head: Normocephalic and atraumatic. No abrasion or masses. Hair is normal.      Right Ear: External ear normal.      Left Ear: External ear normal.       Nose: Nose normal.   Eyes:      General: Lids are normal. Gaze aligned appropriately. No scleral icterus.        Right eye: No discharge.         Left eye: No discharge.      Extraocular Movements: Extraocular movements intact.      Conjunctiva/sclera: Conjunctivae normal.   Neck:      Trachea: Phonation normal.   Cardiovascular:      Rate and Rhythm: Normal rate and regular rhythm.      Pulses:           Dorsalis pedis pulses are 2+ on the right side and 2+ on the left side.        Posterior tibial pulses are 1+ on the right side and 1+ on the left side.   Pulmonary:      Effort: Pulmonary effort is normal. No respiratory distress.      Breath sounds: No wheezing, rhonchi or rales.   Abdominal:      General: Abdomen is flat. There is no distension.      Palpations: Abdomen is soft.   Musculoskeletal:         General: No deformity.      Cervical back: Normal range of motion. No erythema.      Right lower leg: No edema.      Left lower leg: No edema.      Right foot: Decreased range of motion. Bunion present. No Charcot foot, foot  drop or prominent metatarsal heads.      Left foot: Decreased range of motion. Bunion present. No Charcot foot, foot drop or prominent metatarsal heads.   Feet:      Right foot:      Skin integrity: Dry skin present. No ulcer, blister, skin breakdown, erythema, warmth, callus or fissure.      Toenail Condition: Right toenails are abnormally thick. Fungal disease present.     Left foot:      Skin integrity: Dry skin present. No ulcer, blister, skin breakdown, erythema, warmth, callus or fissure.      Toenail Condition: Left toenails are abnormally thick. Fungal disease present.     Comments: Fungal infection - 3rd toe nail - left; 3rd and 5th - right    Skin:     Coloration: Skin is not cyanotic, jaundiced or pale.      Findings: No abrasion, abscess, bruising, ecchymosis, erythema, signs of injury, laceration, lesion, petechiae, rash or wound.   Neurological:      General: No focal deficit  present.      Mental Status: She is alert. Mental status is at baseline.      GCS: GCS eye subscore is 4. GCS verbal subscore is 5. GCS motor subscore is 6.      Cranial Nerves: No cranial nerve deficit, dysarthria or facial asymmetry.      Motor: No weakness, tremor, atrophy or seizure activity.      Coordination: Coordination normal.      Gait: Gait is intact.   Psychiatric:         Attention and Perception: Attention and perception normal.         Mood and Affect: Mood and affect normal.         Speech: Speech normal.         Behavior: Behavior normal. Behavior is cooperative.         Thought Content: Thought content normal.        Assessment/Plan:   Janice Cunningham was seen today for follow-up and nail problem.    Diagnoses and all orders for this visit:    Benign hypertension with stage 3b chronic kidney disease (HCC)  Comments:  Stable on Amlodipine. Advised to monitor NSAID use and drink plenty of water.  Orders:  -     amLODIPine (NORVASC) 5 MG tablet; Take 1 tablet by mouth daily    Onychomycosis of toenail  Comments:  Will do another course of oral Terbinafine.  Orders:  -     terbinafine (LAMISIL) 250 MG tablet; Take 1 tablet by mouth daily    Dry skin of feet  Comments:  Recommended GoldBond lotion for dry feet.    Other: I recommended prevnar-20.    I reviewed the plan of care with the patient. Patient acknowledged understanding and agreed with plan of care overall.    Medications Discontinued During This Encounter   Medication Reason    amLODIPine (NORVASC) 5 MG tablet REORDER        General information on medications:  -When it comes to medications, whether with starting or adding a new medication or increasing the dose of a current medication, the benefits and risks have to always be considered and weighed over, especially if one is taking other medications as well.   -There are no medications that have no side effects and that there is always a risk involved with taking a medication.    -If a side effect were  to occur with  starting a new medication or with increasing the dose of a current medication that either the medication can be totally discontinued altogether or simply decrease the dose of it and if this would be the case a follow-up appointment would be deemed necessary.    -The drug allergy list will then be updated with the corresponding side effect(s) if it's deemed to be a true 'drug allergy'.    -The most common adverse effects of medication(s) were addressed at today's visit.    -Lastly, the coverage status of a medication may vary from insurance to insurance and the only way to verify if the medication is covered is to send an actual prescription in.    -The drug formulary of each insurance changes without any warning or notification to the healthcare provider let alone the pharmacy.  -The cost of medications vary from insurance to insurance and the cost is always subject to change just like the drug formulary.    Follow-up: Return in about 5 weeks (around 11/19/2021) for AWV.Marland Kitchen     Patient was informed that if his or her symptoms worsen to follow up with me sooner or go to the nearest ER if the symptoms are very significant and warrant higher level of care.    Regarding my note:  -This note was composed (by me only and not with assistance via a scribe) to the best of my knowledge and recollection of the encounter with the patient using one of my own customized note templates utilizing a combination of typing and dictating with the [employer] medical speech recognition software.  As a result, the note may possibly contain various errors (e.g. spelling, grammar, and non-sensible words/phrases/statements) despite reviewing the note prior to signing it for completion.      Time spent includes some or all of the following, both face-to-face time and non face-to-face time, but is not limited to:  [x]  Preparing to see the patient by reviewing medical records available (notes, labs, imaging, etc.) prior  to seeing the patient.  [x]  Obtaining and/or reviewing the history from the patient.  [x]  Performing a medically appropriate examination.  [x]  Ordering of relevant lab work, medications, referrals, or procedures.  [x]  Discussing patient's medical issues and formulating an assessment and plan.   [x]  Reviewing plan of care with patient.  Answering any questions or concerns.   [x]  Documentation within the electronic health record (EHR)  []  Reviewing records of history relevant to patient's issues after seeing the patient.  []  Discussion or coordination of care with other health care professionals  [x]  Other: length office visit - 15 minutes.  -I spent a significant amount of time discussing various issues as noted above and also with formulating a treatment plan for each specific issue. Patient was given the opportunity to ask me any questions and address any concerns/issues. I also reviewed lab work (if available) as well as prior notes from PCP and/or other specialists if available.     Nema Oatley P. M.D.  Family Medicine  [facility] - Family Medicine -    Electronically signed by , MD M.D. on 10/15/2021 at 7:15 PM.

## 2021-11-19 ENCOUNTER — Encounter

## 2021-11-19 ENCOUNTER — Ambulatory Visit: Admit: 2021-11-19 | Discharge: 2021-11-19 | Payer: MEDICARE | Attending: Family Medicine | Primary: Family Medicine

## 2021-11-19 DIAGNOSIS — I129 Hypertensive chronic kidney disease with stage 1 through stage 4 chronic kidney disease, or unspecified chronic kidney disease: Secondary | ICD-10-CM

## 2021-11-19 MED ORDER — FUROSEMIDE 20 MG PO TABS
20 MG | ORAL_TABLET | Freq: Every day | ORAL | 0 refills | Status: DC
Start: 2021-11-19 — End: 2022-10-07

## 2021-11-19 NOTE — Patient Instructions (Addendum)
Personalized Preventive Plan for Janice Cunningham - 11/19/2021  Medicare offers a range of preventive health benefits. Some of the tests and screenings are paid in full while other may be subject to a deductible, co-insurance, and/or copay.    Some of these benefits include a comprehensive review of your medical history including lifestyle, illnesses that may run in your family, and various assessments and screenings as appropriate.    After reviewing your medical record and screening and assessments performed today your provider may have ordered immunizations, labs, imaging, and/or referrals for you.  A list of these orders (if applicable) as well as your Preventive Care list are included within your After Visit Summary for your review.    Other Preventive Recommendations:    A preventive eye exam performed by an eye specialist is recommended every 1-2 years to screen for glaucoma; cataracts, macular degeneration, and other eye disorders.  A preventive dental visit is recommended every 6 months.  Try to get at least 150 minutes of exercise per week or 10,000 steps per day on a pedometer .  Order or download the FREE "Exercise & Physical Activity: Your Everyday Guide" from [employer]. Call ([phone])[phone] or search [employer] online.  You need 1200-1500 mg of calcium and 1000-2000 IU of vitamin D per day. It is possible to meet your calcium requirement with diet alone, but a vitamin D supplement is usually necessary to meet this goal.  When exposed to the sun, use a sunscreen that protects against both UVA and UVB radiation with an SPF of 30 or greater. Reapply every 2 to 3 hours or after sweating, drying off with a towel, or swimming.  Always wear a seat belt when traveling in a car. Always wear a helmet when riding a bicycle or motorcycle.  Personalized Preventive Plan for Janice Cunningham - 11/19/2021  Medicare offers a range of preventive health benefits. Some of the tests and  screenings are paid in full while other may be subject to a deductible, co-insurance, and/or copay.    Some of these benefits include a comprehensive review of your medical history including lifestyle, illnesses that may run in your family, and various assessments and screenings as appropriate.    After reviewing your medical record and screening and assessments performed today your provider may have ordered immunizations, labs, imaging, and/or referrals for you.  A list of these orders (if applicable) as well as your Preventive Care list are included within your After Visit Summary for your review.    Other Preventive Recommendations:    A preventive eye exam performed by an eye specialist is recommended every 1-2 years to screen for glaucoma; cataracts, macular degeneration, and other eye disorders.  A preventive dental visit is recommended every 6 months.  Try to get at least 150 minutes of exercise per week or 10,000 steps per day on a pedometer .  Order or download the FREE "Exercise & Physical Activity: Your Everyday Guide" from [employer]. Call (573)[phone] or search [employer] online.  You need 1200-1500 mg of calcium and 1000-2000 IU of vitamin D per day. It is possible to meet your calcium requirement with diet alone, but a vitamin D supplement is usually necessary to meet this goal.  When exposed to the sun, use a sunscreen that protects against both UVA and UVB radiation with an SPF of 30 or greater. Reapply every 2 to 3 hours or  after sweating, drying off with a towel, or swimming.  Always wear a seat belt when traveling in a car. Always wear a helmet when riding a bicycle or motorcycle.  Personalized Preventive Plan for Janice Cunningham - 11/19/2021  Medicare offers a range of preventive health benefits. Some of the tests and screenings are paid in full while other may be subject to a deductible, co-insurance, and/or copay.    Some of these benefits include a  comprehensive review of your medical history including lifestyle, illnesses that may run in your family, and various assessments and screenings as appropriate.    After reviewing your medical record and screening and assessments performed today your provider may have ordered immunizations, labs, imaging, and/or referrals for you.  A list of these orders (if applicable) as well as your Preventive Care list are included within your After Visit Summary for your review.    Other Preventive Recommendations:    A preventive eye exam performed by an eye specialist is recommended every 1-2 years to screen for glaucoma; cataracts, macular degeneration, and other eye disorders.  A preventive dental visit is recommended every 6 months.  Try to get at least 150 minutes of exercise per week or 10,000 steps per day on a pedometer .  Order or download the FREE "Exercise & Physical Activity: Your Everyday Guide" from [employer]. Call (845)[phone] or search [employer] online.  You need 1200-1500 mg of calcium and 1000-2000 IU of vitamin D per day. It is possible to meet your calcium requirement with diet alone, but a vitamin D supplement is usually necessary to meet this goal.  When exposed to the sun, use a sunscreen that protects against both UVA and UVB radiation with an SPF of 30 or greater. Reapply every 2 to 3 hours or after sweating, drying off with a towel, or swimming.  Always wear a seat belt when traveling in a car. Always wear a helmet when riding a bicycle or motorcycle.

## 2021-11-19 NOTE — Progress Notes (Signed)
Medicare Annual Wellness Visit    Janice Cunningham is here for [facility] AWV    Assessment & Plan   Benign hypertension with stage 3b chronic kidney disease (HCC)  -     CBC with Auto Differential; Future  -     Comprehensive Metabolic Panel; Future  -     Hemoglobin A1C; Future  -     TSH; Future  -     T4, Free; Future  -     LIPID PANEL; Future  Bilateral leg edema  Comments:  Likely related to combination of CCB and high salt diet. Will lower dose of Amlodipine. Recommended low salt diet. Compression stockings.  Orders:  -     furosemide (LASIX) 20 MG tablet; Take 1 tablet by mouth daily, Disp-90 tablet, R-0Normal  -     CBC with Auto Differential; Future  -     Comprehensive Metabolic Panel; Future  -     Hemoglobin A1C; Future  -     TSH; Future  -     T4, Free; Future  -     LIPID PANEL; Future  Essential hypertension  -     CBC with Auto Differential; Future  -     Comprehensive Metabolic Panel; Future  -     Hemoglobin A1C; Future  -     TSH; Future  -     T4, Free; Future  -     LIPID PANEL; Future  Underweight  -     CBC with Auto Differential; Future  -     Comprehensive Metabolic Panel; Future  -     Hemoglobin A1C; Future  -     TSH; Future  -     T4, Free; Future  -     LIPID PANEL; Future  Prediabetes   -     Hemoglobin A1C; Future  Medicare annual wellness visit, subsequent      Recommendations for Preventive Services Due: see orders and patient instructions/AVS.  Recommended screening schedule for the next 5-10 years is provided to the patient in written form: see Patient Instructions/AVS.     No follow-ups on file.       Subjective       Patient's complete Health Risk Assessment and screening values have been reviewed and are found in Flowsheets. The following problems were reviewed today and where indicated follow up appointments were made and/or referrals ordered.    Positive Risk Factor Screenings with Interventions:                                       Objective   Vitals:    11/19/21 1136    Weight: 115 lb (52.2 kg)   Height: 5\' 6"  (1.676 m)      Body mass index is 18.56 kg/m??.        Physical Exam  Vitals reviewed.   Constitutional:       General: She is awake. She is not in acute distress.     Appearance: She is not ill-appearing or diaphoretic.   HENT:      Head: Normocephalic and atraumatic. No abrasion or masses. Hair is normal.      Right Ear: External ear normal.      Left Ear: External ear normal.      Nose: Nose normal.   Eyes:      General: Lids are  normal. Gaze aligned appropriately. No scleral icterus.        Right eye: No discharge.         Left eye: No discharge.      Extraocular Movements: Extraocular movements intact.      Conjunctiva/sclera: Conjunctivae normal.   Neck:      Trachea: Phonation normal.   Cardiovascular:      Rate and Rhythm: Normal rate and regular rhythm.   Pulmonary:      Effort: Pulmonary effort is normal. No respiratory distress.      Breath sounds: No wheezing, rhonchi or rales.   Abdominal:      General: Abdomen is flat. There is no distension.      Palpations: Abdomen is soft.   Musculoskeletal:         General: No deformity. Normal range of motion.      Cervical back: Normal range of motion. No erythema.      Right lower leg: No edema.      Left lower leg: No edema.   Skin:     Coloration: Skin is not cyanotic, jaundiced or pale.      Findings: No abrasion, abscess, bruising, ecchymosis, erythema, signs of injury, laceration, lesion, petechiae, rash or wound.   Neurological:      General: No focal deficit present.      Mental Status: She is alert. Mental status is at baseline.      GCS: GCS eye subscore is 4. GCS verbal subscore is 5. GCS motor subscore is 6.      Cranial Nerves: No cranial nerve deficit, dysarthria or facial asymmetry.      Motor: No weakness, tremor, atrophy or seizure activity.      Coordination: Coordination normal.      Gait: Gait is intact.   Psychiatric:         Attention and Perception: Attention and perception normal.         Mood and  Affect: Mood and affect normal.         Speech: Speech normal.         Behavior: Behavior normal. Behavior is cooperative.         Thought Content: Thought content normal.              Allergies   Allergen Reactions    Penicillins Rash     Prior to Visit Medications    Medication Sig Taking? Authorizing Provider   furosemide (LASIX) 20 MG tablet Take 1 tablet by mouth daily Yes Christoper Allegra, MD   amLODIPine (NORVASC) 5 MG tablet Take 1 tablet by mouth daily Yes Christoper Allegra, MD   terbinafine (LAMISIL) 250 MG tablet Take 1 tablet by mouth daily Yes Christoper Allegra, MD       CareTeam (Including outside providers/suppliers regularly involved in providing care):   Patient Care Team:  Christoper Allegra, MD as PCP - General (Family Medicine)  Christoper Allegra, MD as PCP - [facility] Empaneled Provider     Reviewed and updated this visit:  Allergies   Meds          I, Jorge Mandril, LPN, 16/[birth year], performed the documented evaluation under the direct supervision of the attending physician.

## 2021-11-20 LAB — COMPREHENSIVE METABOLIC PANEL
ALT: 14 U/L (ref 10–40)
AST: 22 U/L (ref 15–37)
Albumin/Globulin Ratio: 1.7 (ref 1.1–2.2)
Albumin: 4.6 g/dL (ref 3.4–5.0)
Alkaline Phosphatase: 67 U/L (ref 40–129)
Anion Gap: 17 — ABNORMAL HIGH (ref 3–16)
BUN: 21 mg/dL — ABNORMAL HIGH (ref 7–20)
CO2: 22 mmol/L (ref 21–32)
Calcium: 9.9 mg/dL (ref 8.3–10.6)
Chloride: 101 mmol/L (ref 99–110)
Creatinine: 1.2 mg/dL (ref 0.6–1.2)
Est, Glom Filt Rate: 42 — AB (ref >60)
Glucose: 85 mg/dL (ref 70–99)
Potassium: 4.6 mmol/L (ref 3.5–5.1)
Sodium: 140 mmol/L (ref 136–145)
Total Bilirubin: 0.3 mg/dL (ref 0.0–1.0)
Total Protein: 7.3 g/dL (ref 6.4–8.2)

## 2021-11-20 LAB — CBC WITH AUTO DIFFERENTIAL
Basophils %: 0.7 %
Basophils Absolute: 0 10*3/uL (ref 0.0–0.2)
Eosinophils %: 0.8 %
Eosinophils Absolute: 0 10*3/uL (ref 0.0–0.6)
Hematocrit: 37.3 % (ref 36.0–48.0)
Hemoglobin: 12.1 g/dL (ref 12.0–16.0)
Lymphocytes %: 27.9 %
Lymphocytes Absolute: 1.4 10*3/uL (ref 1.0–5.1)
MCH: 31 pg (ref 26.0–34.0)
MCHC: 32.3 g/dL (ref 31.0–36.0)
MCV: 96 fL (ref 80.0–100.0)
MPV: 9.5 fL (ref 5.0–10.5)
Monocytes %: 7.7 %
Monocytes Absolute: 0.4 10*3/uL (ref 0.0–1.3)
Neutrophils %: 62.9 %
Neutrophils Absolute: 3.1 10*3/uL (ref 1.7–7.7)
Platelets: 237 10*3/uL (ref 135–450)
RBC: 3.89 M/uL — ABNORMAL LOW (ref 4.00–5.20)
RDW: 13.5 % (ref 12.4–15.4)
WBC: 4.9 10*3/uL (ref 4.0–11.0)

## 2021-11-20 LAB — T4, FREE: T4 Free: 1.3 ng/dL (ref 0.9–1.8)

## 2021-11-20 LAB — LIPID PANEL
Cholesterol, Total: 198 mg/dL (ref 0–199)
HDL: 84 mg/dL — ABNORMAL HIGH (ref 40–60)
LDL Calculated: 99 mg/dL (ref <100)
Triglycerides: 73 mg/dL (ref 0–150)
VLDL Cholesterol Calculated: 15 mg/dL

## 2021-11-20 LAB — HEMOGLOBIN A1C
Hemoglobin A1C: 5.2 %
eAG: 102.5 mg/dL

## 2021-11-20 LAB — TSH: TSH: 3.06 u[IU]/mL (ref 0.27–4.20)

## 2021-12-01 NOTE — Progress Notes (Signed)
Medicare Annual Wellness Visit    Janice Cunningham is here for [facility] AWV    Assessment & Plan   Benign hypertension with stage 3b chronic kidney disease (HCC)  -     CBC with Auto Differential; Future  -     Comprehensive Metabolic Panel; Future  -     Hemoglobin A1C; Future  -     TSH; Future  -     T4, Free; Future  -     LIPID PANEL; Future  Bilateral leg edema  Comments:  Likely related to combination of CCB and high salt diet. Will lower dose of Amlodipine. Recommended low salt diet. Compression stockings.  Orders:  -     furosemide (LASIX) 20 MG tablet; Take 1 tablet by mouth daily, Disp-90 tablet, R-0Normal  -     CBC with Auto Differential; Future  -     Comprehensive Metabolic Panel; Future  -     Hemoglobin A1C; Future  -     TSH; Future  -     T4, Free; Future  -     LIPID PANEL; Future  Essential hypertension  -     CBC with Auto Differential; Future  -     Comprehensive Metabolic Panel; Future  -     Hemoglobin A1C; Future  -     TSH; Future  -     T4, Free; Future  -     LIPID PANEL; Future  Underweight  -     CBC with Auto Differential; Future  -     Comprehensive Metabolic Panel; Future  -     Hemoglobin A1C; Future  -     TSH; Future  -     T4, Free; Future  -     LIPID PANEL; Future  Prediabetes   -     Hemoglobin A1C; Future  Medicare annual wellness visit, subsequent    Recommendations for Preventive Services Due: see orders and patient instructions/AVS.  Recommended screening schedule for the next 5-10 years is provided to the patient in written form: see Patient Instructions/AVS.     No follow-ups on file.     Subjective       Patient's complete Health Risk Assessment and screening values have been reviewed and are found in Flowsheets. The following problems were reviewed today and where indicated follow up appointments were made and/or referrals ordered.    Positive Risk Factor Screenings with Interventions:    Fall Risk:  Do you feel unsteady or are you worried about falling? : no  2 or more  falls in past year?: (!) yes (fall)  Fall with injury in past year?: (!) yes (fell in home and hit head went to ER)     Interventions:    Patient declines further evaluation    Cognitive:   Words recalled: 0 Words Recalled   Clock Drawing Test (CDT): Normal   Total Score: (!) 2   Total Score Interpretation: Abnormal Mini-Cog      Interventions:  Patient declined further evaultation.  Safety:  Do you have either shower bars, grab bars, non-slip mats or non-slip surfaces in your shower or bathtub?: (!) No    Interventions:  Patient has seat in shower.                     Objective   Vitals:    11/19/21 1136   BP: (!) 98/56   Pulse: 66   Temp: 97.2 ??  F (36.2 ??C)   SpO2: 98%   Weight: 115 lb (52.2 kg)   Height: 5\' 6"  (1.676 m)      Body mass index is 18.56 kg/m??.      General Appearance: alert and oriented to person, place and time, well developed and well- nourished, in no acute distress  Skin: warm and dry, no rash or erythema  Eyes: pupils equal, round, and reactive to light, extraocular eye movements intact, conjunctivae normal  Pulmonary/Chest: clear to auscultation bilaterally- no wheezes, rales or rhonchi, normal air movement, no respiratory distress  Abdomen: soft, non-tender, non-distended, normal bowel sounds, no masses or organomegaly  Extremities: no cyanosis, clubbing or edema       Allergies   Allergen Reactions    Penicillins Rash     Prior to Visit Medications    Medication Sig Taking? Authorizing Provider   furosemide (LASIX) 20 MG tablet Take 1 tablet by mouth daily Yes , MD   amLODIPine (NORVASC) 5 MG tablet Take 1 tablet by mouth daily Yes Christoper Allegra, MD   terbinafine (LAMISIL) 250 MG tablet Take 1 tablet by mouth daily Yes Christoper Allegra, MD       CareTeam (Including outside providers/suppliers regularly involved in providing care):   Patient Care Team:  Christoper Allegra, MD as PCP - General (Family Medicine)  Christoper Allegra, MD as PCP - [facility] Empaneled Provider     Reviewed  and updated this visit:  Allergies   Meds          I, [facility], LPN, Jorge Mandril, performed the documented evaluation under the direct supervision of the attending physician.

## 2021-12-01 NOTE — Progress Notes (Deleted)
Medicare Annual Wellness Visit    Janice Cunningham is here for [facility] AWV    Assessment & Plan   Benign hypertension with stage 3b chronic kidney disease (HCC)  -     CBC with Auto Differential; Future  -     Comprehensive Metabolic Panel; Future  -     Hemoglobin A1C; Future  -     TSH; Future  -     T4, Free; Future  -     LIPID PANEL; Future  Bilateral leg edema  Comments:  Likely related to combination of CCB and high salt diet. Will lower dose of Amlodipine. Recommended low salt diet. Compression stockings.  Orders:  -     furosemide (LASIX) 20 MG tablet; Take 1 tablet by mouth daily, Disp-90 tablet, R-0Normal  -     CBC with Auto Differential; Future  -     Comprehensive Metabolic Panel; Future  -     Hemoglobin A1C; Future  -     TSH; Future  -     T4, Free; Future  -     LIPID PANEL; Future  Essential hypertension  -     CBC with Auto Differential; Future  -     Comprehensive Metabolic Panel; Future  -     Hemoglobin A1C; Future  -     TSH; Future  -     T4, Free; Future  -     LIPID PANEL; Future  Underweight  -     CBC with Auto Differential; Future  -     Comprehensive Metabolic Panel; Future  -     Hemoglobin A1C; Future  -     TSH; Future  -     T4, Free; Future  -     LIPID PANEL; Future  Prediabetes   -     Hemoglobin A1C; Future  Medicare annual wellness visit, subsequent      Recommendations for Preventive Services Due: see orders and patient instructions/AVS.  Recommended screening schedule for the next 5-10 years is provided to the patient in written form: see Patient Instructions/AVS.     No follow-ups on file.     Subjective   {OPTIONAL - WILL AUTO-DELETE IF NOT MWNU:[id number]}    Patient's complete Health Risk Assessment and screening values have been reviewed and are found in Flowsheets. The following problems were reviewed today and where indicated follow up appointments were made and/or referrals ordered.    Positive Risk Factor Screenings with Interventions:    Fall Risk:  Do you feel  unsteady or are you worried about falling? : no  2 or more falls in past year?: (!) yes (fall)  Fall with injury in past year?: (!) yes (fell in home and hit head went to ER)     Interventions:    {Fall Interventions:201460083}    Cognitive:   Words recalled: 0 Words Recalled   Clock Drawing Test (CDT): Normal   Total Score: (!) 2   Total Score Interpretation: Abnormal Mini-Cog      Interventions:  {Cogntive Interventions:201460083}                 Safety:  Do you have either shower bars, grab bars, non-slip mats or non-slip surfaces in your shower or bathtub?: (!) No  Interventions:  {Interventions:201460090}       {OPTIONAL- LDCT, CVD, STI Counseling Statements:[phone]}              Objective   Vitals:  11/19/21 1136   BP: (!) 98/56   Pulse: 66   Temp: 97.2 ??F (36.2 ??C)   SpO2: 98%   Weight: 115 lb (52.2 kg)   Height: 5\' 6"  (1.676 m)      Body mass index is 18.56 kg/m??.        {OPTIONAL - GENERAL PHYSICAL EXAM (WILL AUTO-DELETE IF NOT       Allergies   Allergen Reactions   ??? Penicillins Rash     Prior to Visit Medications    Medication Sig Taking? Authorizing Provider   furosemide (LASIX) 20 MG tablet Take 1 tablet by mouth daily Yes [id number]):[id number]}, MD   amLODIPine (NORVASC) 5 MG tablet Take 1 tablet by mouth daily Yes Christoper Allegra, MD   terbinafine (LAMISIL) 250 MG tablet Take 1 tablet by mouth daily Yes Christoper Allegra, MD       CareTeam (Including outside providers/suppliers regularly involved in providing care):   Patient Care Team:  Christoper Allegra, MD as PCP - General (Family Medicine)  Christoper Allegra, MD as PCP - [facility] Empaneled Provider     Reviewed and updated this visit:  Allergies   Meds          I, [facility], LPN, Jorge Mandril, performed the documented evaluation under the direct supervision of the attending physician.

## 2022-02-13 ENCOUNTER — Encounter

## 2022-02-25 NOTE — Patient Instructions (Addendum)
Restart Lamisil 250 mg once daily    Have liver panel in 6 weeks    Follow up with PCP in 3 months     [facility] [place] - Onalee Hua, DPM   [facility] [address]   [address]   [place], [place] [address]   Phone: [phone]   Fax: [phone]

## 2022-02-26 ENCOUNTER — Ambulatory Visit: Admit: 2022-02-26 | Discharge: 2022-02-26 | Payer: MEDICARE | Attending: Family Health | Primary: Family Medicine

## 2022-02-26 DIAGNOSIS — B351 Tinea unguium: Secondary | ICD-10-CM

## 2022-02-26 MED ORDER — TERBINAFINE HCL 250 MG PO TABS
250 MG | ORAL_TABLET | Freq: Every day | ORAL | 0 refills | Status: AC
Start: 2022-02-26 — End: 2022-05-21

## 2022-02-26 NOTE — Progress Notes (Signed)
SUBJECTIVE:    Patient ID:  Janice Cunningham is a 88 y.o. female      Patient is here for an acute visit for concerns of chronic toenail fungus.  She was on Lamisil for 12 weeks last summer with positive results.  States she has been to see the podiatrist in the past and he removed part of her toe nails and cleaned them up.  She is requesting to be restarted on Lamisil.  Recent liver function on 11/19/2021 was within normal limits.  Encourage to follow up with podiatrist to have other nails trimmed and reevaluation of her great toes.        Current Outpatient Medications on File Prior to Visit   Medication Sig Dispense Refill    furosemide (LASIX) 20 MG tablet Take 1 tablet by mouth daily 90 tablet 0    amLODIPine (NORVASC) 5 MG tablet Take 1 tablet by mouth daily 90 tablet 1     No current facility-administered medications on file prior to visit.      No past medical history on file.  Past Surgical History:   Procedure Laterality Date    HYSTERECTOMY, TOTAL ABDOMINAL (CERVIX REMOVED)       Family History   Problem Relation Age of Onset    Ovarian Cancer Mother     Heart Attack Sister     Dementia Sister     Dementia Brother      Social History     Socioeconomic History    Marital status: Married     Spouse name: Not on file    Number of children: Not on file    Years of education: Not on file    Highest education level: Not on file   Occupational History    Not on file   Tobacco Use    Smoking status: Never    Smokeless tobacco: Never   Vaping Use    Vaping Use: Never used   Substance and Sexual Activity    Alcohol use: Yes    Drug use: Never    Sexual activity: Not on file   Other Topics Concern    Not on file   Social History Narrative    Not on file     Social Determinants of Health     Financial Resource Strain: Not on file   Food Insecurity: Not on file   Transportation Needs: Not on file   Physical Activity: Insufficiently Active    Days of Exercise per Week: 1 day    Minutes of Exercise per Session: 20 min    Stress: Not on file   Social Connections: Not on file   Intimate Partner Violence: Not on file   Housing Stability: Not on file       Review of Systems   Constitutional:  Negative for chills and fever.   Eyes:  Negative for visual disturbance.   Respiratory:  Negative for cough, chest tightness, shortness of breath and wheezing.    Cardiovascular:  Negative for chest pain, palpitations and leg swelling.   Gastrointestinal:  Negative for abdominal pain, constipation, diarrhea, nausea and vomiting.   Musculoskeletal:  Negative for arthralgias and myalgias.   Skin:  Negative for rash.        Bilateral toe nails   Neurological:  Negative for headaches.     OBJECTIVE:    Physical Exam  Vitals and nursing note reviewed.   Constitutional:       General: She is not in acute  distress.     Appearance: She is well-developed. She is not ill-appearing.   HENT:      Head: Normocephalic and atraumatic.      Right Ear: External ear normal.      Left Ear: External ear normal.      Nose: Nose normal.      Mouth/Throat:      Mouth: Mucous membranes are moist.   Eyes:      Conjunctiva/sclera: Conjunctivae normal.      Pupils: Pupils are equal, round, and reactive to light.   Neck:      Trachea: No tracheal deviation.   Cardiovascular:      Rate and Rhythm: Normal rate and regular rhythm.      Heart sounds: Normal heart sounds.   Pulmonary:      Effort: Pulmonary effort is normal. No respiratory distress.      Breath sounds: Normal breath sounds. No wheezing or rales.   Chest:      Chest wall: No tenderness.   Abdominal:      General: Bowel sounds are normal. There is no distension.      Palpations: Abdomen is soft. There is no mass.      Tenderness: There is no abdominal tenderness.      Hernia: No hernia is present.   Musculoskeletal:         General: Normal range of motion.      Cervical back: Normal range of motion and neck supple.   Skin:     General: Skin is warm and dry.      Capillary Refill: Capillary refill takes less than 2  seconds.      Findings: No rash.      Comments: Bilateral great toe nails discolored, rest of the toenails are thick and long   Neurological:      Mental Status: She is alert and oriented to person, place, and time.   Psychiatric:         Behavior: Behavior normal.     BP 120/64    Pulse 73    Temp 97.1 ??F (36.2 ??C)    Wt 116 lb (52.6 kg)    SpO2 97%    BMI 18.72 kg/m??    BP Readings from Last 3 Encounters:   02/26/22 120/64   11/19/21 (!) 98/56   10/15/21 118/76      Wt Readings from Last 3 Encounters:   02/26/22 116 lb (52.6 kg)   11/19/21 115 lb (52.2 kg)   10/15/21 115 lb (52.2 kg)       ASSESSMENT & PLAN:    1. Onychomycosis of great toe, bilateral  - Restart terbinafine 250 mg once daily  - terbinafine (LAMISIL) 250 MG tablet; Take 1 tablet by mouth daily  Dispense: 84 tablet; Refill: 0  - Hepatic Function Panel; Future     2. Essential hypertension  - Stable, continue amlodipine 5 mg once daily     Continue current treatment plan.    Current Outpatient Medications   Medication Sig Dispense Refill    terbinafine (LAMISIL) 250 MG tablet Take 1 tablet by mouth daily 84 tablet 0    furosemide (LASIX) 20 MG tablet Take 1 tablet by mouth daily 90 tablet 0    amLODIPine (NORVASC) 5 MG tablet Take 1 tablet by mouth daily 90 tablet 1     No current facility-administered medications for this visit.      Return in about 3 months (around 05/29/2022), or if symptoms  worsen or fail to improve, for onychomycosis bilatral great toe, HTN.    Janel received counseling on the following healthy behaviors: nutrition, exercise, and medication adherence    Patient given educational materials on Onychomycosis     Discussed use, benefit, and side effects of prescribed medications.  Barriers to medication compliance addressed.  All patient questions answered.  Pt voiced understanding.     Call office if experience side effects from medications.      Please note that some or all of this record was generated using voice recognition  software. If there are any questions about the content of this document, please contact the author as some errors in transcription may have occurred.

## 2022-05-21 ENCOUNTER — Ambulatory Visit: Admit: 2022-05-21 | Discharge: 2022-05-21 | Payer: MEDICARE | Attending: Family Medicine | Primary: Family Medicine

## 2022-05-21 DIAGNOSIS — R29898 Other symptoms and signs involving the musculoskeletal system: Secondary | ICD-10-CM

## 2022-05-21 NOTE — Progress Notes (Unsigned)
Janice Cunningham   88 y.o. female   09-04-1926    HPI:    Patient was last seen by me on 11/19/2021.  During our last office visit, the main issue(s) that we focused on were:   -This was her AWV.    Reason(s) for visit:   No chief complaint on file.    -Interval history-    []  No new significant health event(s)/problem(s) occurred since our last office visit.    []  No new health issues/concerns discussed during today's office visit.    []  New health issue(s)/concern(s):    []  Other noteworthy comment(s):     -Chronic health issue(s)-    HTN:    Lab Results   Component Value Date    LABA1C 5.2 11/19/2021       Labs Renal Latest Ref Rng & Units 11/19/2021 07/10/2021 06/12/2021 09/25/2014 02/18/2010   BUN 7 - 20 mg/dL 07/12/2021) 06/14/2021) 09/27/2014) 04/20/2010) 19(H)   Cr 0.6 - 1.2 mg/dL 1.2 1.2 01(U) 27(O) 1.0   K 3.5 - 5.1 mmol/L 4.6 4.9 4.3 4.7 5.2(H)   Na 136 - 145 mmol/L 140 140 139 140 139       Lab Results   Component Value Date/Time    LABMICR YES 06/12/2021 10:40 AM       Lab Results   Component Value Date    LDLCALC 99 11/19/2021       BP Readings from Last 3 Encounters:   02/26/22 120/64   11/19/21 (!) 98/56   10/15/21 118/76     -SBP (avg):     -DBP (avg):  -Pulse (avg):   -Patient has not been checking BP readings regularly. ***  -Patient denied issues with frequent headache, chest pain, shortness of breath, palpitations, malaise, etc.  -No BP med    1 yr ago no proximal weakness  No falls, but someone has to stand behind her    Allergies   Allergen Reactions    Penicillins Rash       Current Outpatient Medications on File Prior to Visit   Medication Sig Dispense Refill    terbinafine (LAMISIL) 250 MG tablet Take 1 tablet by mouth daily 84 tablet 0    furosemide (LASIX) 20 MG tablet Take 1 tablet by mouth daily 90 tablet 0    amLODIPine (NORVASC) 5 MG tablet Take 1 tablet by mouth daily 90 tablet 1     No current facility-administered medications on file prior to visit.        Family History   Problem Relation Age of Onset     Ovarian Cancer Mother     Heart Attack Sister     Dementia Sister     Dementia Brother        Social History     Tobacco Use    Smoking status: Never    Smokeless tobacco: Never   Substance Use Topics    Alcohol use: Yes        Lab Results   Component Value Date    WBC 4.9 11/19/2021    HGB 12.1 11/19/2021    HCT 37.3 11/19/2021    MCV 96.0 11/19/2021    PLT 237 11/19/2021         Chemistry        Component Value Date/Time    NA 140 11/19/2021 1853    K 4.6 11/19/2021 1853    CL 101 11/19/2021 1853    CO2 22 11/19/2021 1853    BUN  21 (H) 11/19/2021 1853    CREATININE 1.2 11/19/2021 1853        Component Value Date/Time    CALCIUM 9.9 11/19/2021 1853    ALKPHOS 67 11/19/2021 1853    AST 22 11/19/2021 1853    ALT 14 11/19/2021 1853    BILITOT 0.3 11/19/2021 1853          Lab Results   Component Value Date    ALT 14 11/19/2021    AST 22 11/19/2021    ALKPHOS 67 11/19/2021    BILITOT 0.3 11/19/2021       Review of Systems   ***    Wt Readings from Last 3 Encounters:   02/26/22 116 lb (52.6 kg)   11/19/21 115 lb (52.2 kg)   10/15/21 115 lb (52.2 kg)       BP Readings from Last 3 Encounters:   02/26/22 120/64   11/19/21 (!) 98/56   10/15/21 118/76       Pulse Readings from Last 3 Encounters:   02/26/22 73   11/19/21 66   10/15/21 77       There were no vitals taken for this visit.     Physical Exam   ***    Assessment/Plan:   There are no diagnoses linked to this encounter.  I reviewed the plan of care with the patient. Patient acknowledged understanding and agreed with plan of care overall.    There are no discontinued medications.     General information on medications:  -When it comes to medications, whether with starting or adding a new medication or increasing the dose of a current medication, the benefits and risks have to always be considered and weighed over, especially if one is taking other medications as well.   -There are no medications that have no side effects and that there is always a risk involved with  taking a medication.    -If a side effect were to occur with starting a new medication or with increasing the dose of a current medication that either the medication can be totally discontinued altogether or simply decrease the dose of it and if this would be the case a follow-up appointment would be deemed necessary.    -The drug allergy list will then be updated with the corresponding side effect(s) if it's deemed to be a true 'drug allergy'.    -The most common adverse effects of medication(s) were addressed at today's visit.    -Lastly, the coverage status of a medication may vary from insurance to insurance and the only way to verify if the medication is covered is to send an actual prescription in.    -The drug formulary of each insurance changes without any warning or notification to the healthcare provider let alone the pharmacy.  -The cost of medications vary from insurance to insurance and the cost is always subject to change just like the drug formulary.    Follow-up: No follow-ups on file..     Patient was informed that if his or her symptoms worsen to follow up with me sooner or go to the nearest ER if the symptoms are very significant and warrant higher level of care.    Regarding my note:  -This note was composed (by me only and not with assistance via a scribe) to the best of my knowledge and recollection of the encounter with the patient using one of my own customized note templates utilizing a combination of typing and dictating with the [employer]  [employer] medical Engineer, civil (consulting)speech recognition software.  As a result, the note may possibly contain various errors (e.g. spelling, grammar, and non-sensible words/phrases/statements) despite reviewing the note prior to signing it for completion.      Time spent includes some or all of the following, both face-to-face time and non face-to-face time, but is not limited to:  [x]  Preparing to see the patient by reviewing medical records available (notes, labs,  imaging, etc.) prior to seeing the patient.  [x]  Obtaining and/or reviewing the history from the patient.  [x]  Performing a medically appropriate examination.  [x]  Ordering of relevant lab work, medications, referrals, or procedures.  [x]  Discussing patient's medical issues and formulating an assessment and plan.   [x]  Reviewing plan of care with patient.  Answering any questions or concerns.   [x]  Documentation within the electronic health record (EHR)  []  Reviewing records of history relevant to patient's issues after seeing the patient.  []  Discussion or coordination of care with other health care professionals.  [x]  Other: length office visit - *** minutes.  -I spent a significant amount of time discussing various issues as noted above and also with formulating a treatment plan for each specific issue. Patient was given the opportunity to ask me any questions and address any concerns/issues. I also reviewed lab work (if available) as well as prior notes from PCP and/or other specialists if available.   []  An interpreter was used during today's visit.  Care and attention was made to make a conscious effort to speak in short, comprehensible phrases.  I had to (at times) repeat or rephrase what I had said to the patient and allowed ample time for both patient and interpreter to speak without interruption.      Ader Fritze P. Burna Mortimerocena M.D.  Family Medicine  [facility] [place]    Electronically signed by Christoper Allegrahristian Aldean Pipe, MD M.D. on 05/16/2022 at 10:35 PM.

## 2022-09-09 ENCOUNTER — Ambulatory Visit: Admit: 2022-09-09 | Discharge: 2022-09-09 | Payer: MEDICARE | Attending: Family Medicine | Primary: Family Medicine

## 2022-09-09 DIAGNOSIS — H6691 Otitis media, unspecified, right ear: Secondary | ICD-10-CM

## 2022-09-09 MED ORDER — NEOMYCIN-POLYMYXIN-HC 3.5-10000-1 OT SOLN
Freq: Three times a day (TID) | OTIC | 0 refills | Status: AC
Start: 2022-09-09 — End: 2022-09-19

## 2022-09-09 MED ORDER — CEFDINIR 300 MG PO CAPS
300 MG | ORAL_CAPSULE | Freq: Two times a day (BID) | ORAL | 0 refills | Status: AC
Start: 2022-09-09 — End: 2022-09-19

## 2022-09-09 NOTE — Progress Notes (Signed)
Janice Cunningham (DOB:  [birth year]) is a 88 y.o. female,New patient, here for evaluation of the following chief complaint(s):  Otalgia (Right ear concerns (odor) x 1 week.)      ASSESSMENT/PLAN:  1. Acute right otitis media    - cefdinir (OMNICEF) 300 MG capsule; Take 1 capsule by mouth 2 times daily for 10 days  Dispense: 20 capsule; Refill: 0    2. Acute infective otitis externa, right    - neomycin-polymyxin-hydrocortisone (CORTISPORIN) 3.5-10000-1 otic solution; Place 4 drops into the right ear 3 times daily for 10 days Instill into right Ear  Dispense: 10 mL; Refill: 0       No follow-ups on file.    SUBJECTIVE/OBJECTIVE:  Pt presented with her daughter with one wk h/o Rt otalgia.pt is not allergic to PCN      Otalgia   There is pain in the right ear. This is a new problem. The current episode started in the past 7 days. The problem has been gradually worsening. There has been no fever. Associated symptoms include ear discharge and hearing loss (has hearing aid). Pertinent negatives include no abdominal pain, coughing, diarrhea, headaches, neck pain, rash, rhinorrhea, sore throat or vomiting. She has tried nothing for the symptoms.       Vitals:    09/09/22 1823   BP: (!) 153/72   Pulse: 92   Temp: 98.3 F (36.8 C)   SpO2: 99%   Weight: 118 lb 12.8 oz (53.9 kg)   Height: 5\' 3"  (1.6 m)       Review of Systems   HENT:  Positive for ear discharge, ear pain and hearing loss (has hearing aid). Negative for rhinorrhea and sore throat.    Respiratory:  Negative for cough.    Gastrointestinal:  Negative for abdominal pain, diarrhea and vomiting.   Musculoskeletal:  Negative for neck pain.   Skin:  Negative for rash.   Neurological:  Negative for headaches.       Physical Exam  Constitutional:       General: She is not in acute distress.  HENT:      Right Ear: Drainage (foul smelling,crusty and edematous Rt ear canal) present. Tympanic membrane is erythematous.      Nose: No congestion.      Mouth/Throat:      Mouth:  Mucous membranes are moist.      Pharynx: Oropharynx is clear.   Eyes:      Conjunctiva/sclera: Conjunctivae normal.      Pupils: Pupils are equal, round, and reactive to light.   Cardiovascular:      Rate and Rhythm: Normal rate.   Pulmonary:      Effort: Pulmonary effort is normal. No respiratory distress.      Breath sounds: Normal breath sounds.   Musculoskeletal:      Cervical back: Neck supple. No rigidity.   Skin:     Findings: No rash.   Neurological:      Mental Status: She is alert.           An electronic signature was used to authenticate this note.    --Marcelina Morel, MD

## 2022-10-05 NOTE — Telephone Encounter (Signed)
Pt daughter called stating she thought her mother was having a heart attack and called the ambulance, the ambulance showed up and took her vitals and stated everything was stable. Pt refused to go to the hospital. Pt daughter called to get an appt with pcp. Pt scheduled for 10/07/2022. Pt also spoke with LPN Jacque and was advised to go to hospital if she looses conscious, cant breath or blood pressure is out of control.

## 2022-10-07 ENCOUNTER — Ambulatory Visit: Admit: 2022-10-07 | Discharge: 2022-10-07 | Payer: MEDICARE | Attending: Family Medicine | Primary: Family Medicine

## 2022-10-07 DIAGNOSIS — R079 Chest pain, unspecified: Secondary | ICD-10-CM

## 2022-10-07 MED ORDER — FUROSEMIDE 20 MG PO TABS
20 MG | ORAL_TABLET | Freq: Every day | ORAL | 3 refills | Status: AC
Start: 2022-10-07 — End: ?

## 2022-10-07 MED ORDER — AMLODIPINE BESYLATE 5 MG PO TABS
5 MG | ORAL_TABLET | Freq: Every day | ORAL | 3 refills | Status: AC
Start: 2022-10-07 — End: ?

## 2022-10-07 NOTE — Telephone Encounter (Signed)
Patient was scheduled an appointment. Closing out encounter.    Janice Cunningham

## 2022-10-07 NOTE — Progress Notes (Signed)
Janice Cunningham   88 y.o. female   [birth year]    HPI:    Patient was last seen by me on 05/21/2022.      Reason(s) for visit:   Chief Complaint   Patient presents with    Other     Pt's daughter reported that pt was having chest pain on 10/21 and that they called 911 but pt refused to go to hospital. She reported that vital signs were normal but BP was slightly elevated. [employer] didn't feel that pt needed to go to the hospital.    Pt's granddaughter is currently caring for her. STD paperwork needed for leave from work.    Medication Refill     On 10/05/2022, a telephone encounter was placed...    "Pt daughter called stating she thought her mother was having a heart attack and called the ambulance, the ambulance showed up and took her vitals and stated everything was stable. Pt refused to go to the hospital. Pt daughter called to get an appt with pcp. Pt scheduled for 10/07/2022. Pt also spoke with LPN Jacque and was advised to go to hospital if she looses conscious, cant breath or blood pressure is out of control."    Patient was accompanied by her daughter and granddaughter.  Patient's daughter who mainly accompanies patient to her appointments was not the daughter who called into our office so she and the granddaughter did not actually witness the event.  The patient reported that she was able to recall the event.  She was at home and reported that she was sitting down and felt a brief pressure type sensation on the left side of her chest that resolved spontaneously without intervention.  She denied accompanying symptoms of SOB, palpitations, cough, sore throat, chest congestion, etc.   Paramedics were called and she was assessed and her condition didn't rise to the level where it warranted evaluation in the ER.  Patient denied issues of chest pain during the time of office visit today.  She has no hx of MI let alone issues of recurrent chest pain.  She has not had a stress test done before.  No [place] of CAD.   She has history of HTN mainly takes Furosemide for treatment of BLE edema and at times take her Amlodipine, but not consistently.      Allergies   Allergen Reactions    Penicillins Rash       No current outpatient medications on file prior to visit.     No current facility-administered medications on file prior to visit.        Family History   Problem Relation Age of Onset    Ovarian Cancer Mother     Heart Attack Sister     Dementia Sister     Dementia Brother        Social History     Tobacco Use    Smoking status: Never    Smokeless tobacco: Never   Substance Use Topics    Alcohol use: Yes        Lab Results   Component Value Date    WBC 4.9 11/19/2021    HGB 12.1 11/19/2021    HCT 37.3 11/19/2021    MCV 96.0 11/19/2021    PLT 237 11/19/2021         Chemistry        Component Value Date/Time    NA 140 11/19/2021 1853    K 4.6 11/19/2021 1853  CL 101 11/19/2021 1853    CO2 22 11/19/2021 1853    BUN 21 (H) 11/19/2021 1853    CREATININE 1.2 11/19/2021 1853        Component Value Date/Time    CALCIUM 9.9 11/19/2021 1853    ALKPHOS 67 11/19/2021 1853    AST 22 11/19/2021 1853    ALT 14 11/19/2021 1853    BILITOT 0.3 11/19/2021 1853          Lab Results   Component Value Date    ALT 14 11/19/2021    AST 22 11/19/2021    ALKPHOS 67 11/19/2021    BILITOT 0.3 11/19/2021       Review of Systems   Constitutional:  Negative for activity change, appetite change, fatigue, fever and unexpected weight change.   HENT:  Negative for congestion, rhinorrhea, sinus pressure and trouble swallowing.    Respiratory:  Negative for cough, chest tightness, shortness of breath and wheezing.    Cardiovascular:  Negative for chest pain, palpitations and leg swelling.   Gastrointestinal:  Negative for abdominal distention, abdominal pain, blood in stool, constipation, diarrhea, nausea and vomiting.   Genitourinary:  Negative for dysuria, frequency and hematuria.   Musculoskeletal:  Negative for arthralgias and back pain.   Skin:  Negative  for rash.   Neurological:  Negative for dizziness, weakness, light-headedness, numbness and headaches.   Psychiatric/Behavioral:  Negative for agitation, decreased concentration, dysphoric mood, sleep disturbance and suicidal ideas. The patient is not nervous/anxious.         Wt Readings from Last 3 Encounters:   10/07/22 53.7 kg (118 lb 6.4 oz)   09/09/22 53.9 kg (118 lb 12.8 oz)   05/21/22 53.5 kg (118 lb)       BP Readings from Last 3 Encounters:   10/07/22 138/82   09/09/22 (!) 153/72   05/21/22 128/82       Pulse Readings from Last 3 Encounters:   10/07/22 69   09/09/22 92   05/21/22 85       BP 138/82   Pulse 69   Temp 97.6 F (36.4 C)   Wt 53.7 kg (118 lb 6.4 oz)   SpO2 98%   BMI 20.97 kg/m      Physical Exam  Vitals reviewed.   Constitutional:       General: She is awake. She is not in acute distress.     Appearance: She is not ill-appearing or diaphoretic.   HENT:      Head: Normocephalic and atraumatic. No abrasion or masses. Hair is normal.      Right Ear: External ear normal.      Left Ear: External ear normal.      Nose: Nose normal.   Eyes:      General: Lids are normal. Gaze aligned appropriately. No scleral icterus.        Right eye: No discharge.         Left eye: No discharge.      Extraocular Movements: Extraocular movements intact.      Conjunctiva/sclera: Conjunctivae normal.   Neck:      Trachea: Phonation normal.   Cardiovascular:      Rate and Rhythm: Normal rate and regular rhythm.   Pulmonary:      Effort: Pulmonary effort is normal. No respiratory distress.      Breath sounds: No wheezing, rhonchi or rales.   Abdominal:      General: Abdomen is flat. There is no distension.  Palpations: Abdomen is soft.   Musculoskeletal:         General: No deformity. Normal range of motion.      Cervical back: Normal range of motion. No erythema.      Right lower leg: 1+ Pitting Edema present.      Left lower leg: 1+ Pitting Edema present.   Skin:     Coloration: Skin is not cyanotic,  jaundiced or pale.      Findings: No abrasion, abscess, bruising, ecchymosis, erythema, signs of injury, laceration, lesion, petechiae, rash or wound.   Neurological:      General: No focal deficit present.      Mental Status: She is alert. Mental status is at baseline.      GCS: GCS eye subscore is 4. GCS verbal subscore is 5. GCS motor subscore is 6.      Cranial Nerves: No cranial nerve deficit, dysarthria or facial asymmetry.      Motor: No weakness, tremor, atrophy or seizure activity.      Coordination: Coordination normal.      Gait: Gait is intact.   Psychiatric:         Attention and Perception: Attention and perception normal.         Mood and Affect: Mood and affect normal.         Speech: Speech normal.         Behavior: Behavior normal. Behavior is cooperative.         Thought Content: Thought content normal.        Assessment/Plan:   Merrisa was seen today for other and medication refill.    Diagnoses and all orders for this visit:    Chest pain, unspecified type  Comments:  EKG in office was normal. Will plan to do NM stress test. Discussed with pt and family if negative consider obtaining an echocardiogram.  Orders:  -     EKG 12 Lead  -     NM MYOCARDIAL SPECT REST EXERCISE OR RX; Future    Benign hypertension with CKD (chronic kidney disease) stage III (HCC)  Comments:  BP overall stable and controlled on current regiment. She was advised medical compliance.  Orders:  -     amLODIPine (NORVASC) 5 MG tablet; Take 1 tablet by mouth daily  -     furosemide (LASIX) 20 MG tablet; Take 1 tablet by mouth daily  -     EKG 12 Lead    Bilateral leg edema  Comments:  Likely related to combination of CCB and high salt diet. Will lower dose of Amlodipine. Recommended low salt diet. Compression stockings.  Orders:  -     furosemide (LASIX) 20 MG tablet; Take 1 tablet by mouth daily    Does use hearing aid      I reviewed the plan of care with the patient. Patient acknowledged understanding and agreed with plan of  care overall.    Medications Discontinued During This Encounter   Medication Reason    furosemide (LASIX) 20 MG tablet REORDER    amLODIPine (NORVASC) 5 MG tablet REORDER        General information on medications:  -When it comes to medications, whether with starting or adding a new medication or increasing the dose of a current medication, the benefits and risks have to always be considered and weighed over, especially if one is taking other medications as well.   -There are no medications that have no side effects and that  there is always a risk involved with taking a medication.    -If a side effect were to occur with starting a new medication or with increasing the dose of a current medication that either the medication can be totally discontinued altogether or simply decrease the dose of it and if this would be the case a follow-up appointment would be deemed necessary.    -The drug allergy list will then be updated with the corresponding side effect(s) if it's deemed to be a true 'drug allergy'.    -The most common adverse effects of medication(s) were addressed at today's visit.    -Lastly, the coverage status of a medication may vary from insurance to insurance and the only way to verify if the medication is covered is to send an actual prescription in.    -The drug formulary of each insurance changes without any warning or notification to the healthcare provider let alone the pharmacy.  -The cost of medications vary from insurance to insurance and the cost is always subject to change just like the drug formulary.    Follow-up: Return if symptoms worsen or fail to improve..     Patient was informed that if his or her symptoms worsen to follow up with me sooner or go to the nearest ER if the symptoms are very significant and warrant higher level of care.    Regarding my note:  -This note was composed (by me only and not with assistance via a scribe) to the best of my knowledge and recollection of the  encounter with the patient using one of my own customized note templates utilizing a combination of typing and dictating with the [employer] medical speech recognition software.  As a result, the note may possibly contain various errors (e.g. spelling, grammar, and non-sensible words/phrases/statements) despite reviewing the note prior to signing it for completion.      Time spent includes some or all of the following, both face-to-face time and non face-to-face time, but is not limited to:  [x]  Preparing to see the patient by reviewing medical records available (notes, labs, imaging, etc.) prior to seeing the patient.  [x]  Obtaining and/or reviewing the history from the patient.  [x]  Performing a medically appropriate examination.  [x]  Ordering of relevant lab work, medications, referrals, or procedures.  [x]  Discussing patient's medical issues and formulating an assessment and plan.   [x]  Reviewing plan of care with patient.  Answering any questions or concerns.   [x]  Documentation within the electronic health record (EHR)  []  Reviewing records of history relevant to patient's issues after seeing the patient.  []  Discussion or coordination of care with other health care professionals.  [x]  Other: length office visit - 30 minutes.  -I spent a significant amount of time discussing various issues as noted above and also with formulating a treatment plan for each specific issue. Patient was given the opportunity to ask me any questions and address any concerns/issues. I also reviewed lab work (if available) as well as prior notes from PCP and/or other specialists if available.   []  An interpreter was used during today's visit.  Care and attention was made to make a conscious effort to speak in short, comprehensible phrases.  I had to (at times) repeat or rephrase what I had said to the patient and allowed ample time for both patient and interpreter to speak without interruption.      Tracey Stewart P.  M.D.  Family Medicine  [facility] -  Family Medicine - Deborah Chalk    Electronically signed by Christoper Allegra, MD M.D. on 10/07/2022 at 10:55 PM.

## 2022-10-12 NOTE — Telephone Encounter (Signed)
ERROR

## 2022-10-14 ENCOUNTER — Encounter: Payer: MEDICARE | Attending: Family Medicine | Primary: Family Medicine

## 2022-11-06 NOTE — ED Notes (Signed)
Formatting of this note might be different from the original.  Report given to Armed forces training and education officer  Electronically signed by Adele Barthel Ward, RN at 11/06/2022  9:47 PM EST

## 2022-11-06 NOTE — ED Notes (Signed)
Formatting of this note might be different from the original.  Discharge instructions reviewed. Patients caregiver to pick up prescriptions at preferred pharmacy. Will call for follow up with PCP. No further questions at this time. Patients caregiver verbalized understanding. Debara Pickett PA discussed care with granddaughter and great granddaughter. Vella Raring, RN   Electronically signed by Vella Raring, RN at 11/06/2022  6:04 PM EST

## 2022-11-06 NOTE — ED Provider Notes (Signed)
Formatting of this note is different from the original.  Images from the original note were not included.      This patient was seen in full proper PPE following [facility] protocol    CHIEF COMPLAINT  Chief Complaint   Patient presents with    Fall     HPI  Janice Cunningham is a 88 y.o. female via EMS with a past medical history of dementia who presents to the Emergency Department for evaluation of fall. The patient denies pain.  EMS reports that the patient had unwitnessed fall, was found down in the bathroom at home.  Patient was diagnosed with COVID today.    PCP: Jackson Latino, MD    REVIEW OF SYSTEMS  Unable to obtain due to dementia.    PAST MEDICAL HISTORY  Past Medical History:   Diagnosis Date    Dementia (HCC)     ESBL (extended spectrum beta-lactamase) producing bacteria infection 02/26/2021    Str. cath urine     FAMILY HISTORY  No family history on file.    SOCIAL HISTORY  Social History     Socioeconomic History    Marital status: Widowed   Tobacco Use    Smoking status: Never    Smokeless tobacco: Never   Vaping Use    Vaping Use: Never used   Substance and Sexual Activity    Alcohol use: Not Currently    Drug use: Never    Sexual activity: Not Currently     SURGICAL HISTORY  Past Surgical History:   Procedure Laterality Date    HYSTERECTOMY       CURRENT MEDICATIONS  No outpatient medications have been marked as taking for the 11/06/22 encounter [facility] Encounter).     ALLERGIES  Allergies   Allergen Reactions    Penicillins Rash     PHYSICAL EXAM  VITAL SIGNS: BP (!) 175/76   Pulse 74   Temp 99.6 F (37.6 C)   Resp 20   SpO2 98%  ,   Constitutional:  alert and  not in acute distress  CDC criteria BMI < 18.5 = underweight ; 18.5 to <25 = normal, 25.0 to <30= overweight range. 30 - 39 = obese . 40 or greater = severe obesity  HEENT: Normocephalic, Atraumatic, Bilateral external ears normal, Oropharynx moist, No oral exudates, uvula midline, nose normal.   Eyes: PERRLA, EOMI,  Conjunctiva normal, No discharge. No scleral icterus.  Neck: Normal range of motion, No tenderness, Supple, No stridor.  Lymphatic: No lymphadenopathy noted in the anterior or posterior cervical chains.   Cardiovascular: Normal heart rate, Normal rhythm, No audible murmurs, gallops or rubs.   Thorax & Lungs: No respiratory distress, Normal breath sounds, No wheezing, no chest tenderness.  Abdomen: Soft, No abdominal tenderness, no guarding, rigidity or peritoneal signs, No masses, No pulsatile masses, not distended, bowel sounds normal.  Skin: Warm, Dry, No erythema, No obvious rashes noted.   Back: No tenderness, No CVA tenderness.   Genitalia: defer exam  Rectal: deferred  Extremities: Intact distal pulses, No edema, No tenderness, No cyanosis, No clubbing. Distal neurovascular and motor function is intact.   Musculoskeletal: Good range of motion in all major joints. No tenderness to palpation or major deformities noted. No palpable crepitance, step-off, instability or deformity.  Neurologic: GCS 15, no meningeal signs, alert & confused, Normal motor function, Normal sensory function, No focal deficits noted.     5/5 strength on the left upper extremity  5/5  strength on the right upper extremity  5/5 strength on the left lower extremity  5/5 strength on the right lower extremity    Sensory: Equals bilaterally to light touch    RADIOLOGY/PROCEDURES  I, R CAVEY, PA-C, have independently reviewed the following labs:    No data to display    Non-plain film images such as CT, Ultrasound and MRI are read by the radiologist.   No orders to display     Nursing Notes were reviewed    [place] studies reviewed. (See chart for details)    Patient was seen, history and physical exam was performed. Patient remains stable here in the emergency department. Patient's laboratory values, imaging studies and physical examination findings were reviewed and were  reassuring.    Medications - No data to display     Patient is a 88 y.o. female who presents with the above-noted report.  Patient appears  alert and  not in acute distress and vital signs stable on arrival.  Breathing is easy, regular, nonlabored.  No scalp or cervical tenderness or ecchymosis or swelling.  No tenderness to palpation of the back.  No abdominal tenderness.  No focal neurologic deficit.  Electronic medical records reviewed. Diagnostic considerations included but not limited to: Generalized weakness, fall, TBI.  Patient has no clinical findings suggesting significant injury from her fall this evening.  Family has reported that they are unable to take care of her at home in her weakened state.  Consultation made to hospitalist service.  Patient accepted for hospitalization.      DISCHARGE MEDICATIONS  Current Discharge Medication List       START taking these medications    Details   molnupiravir 200 mg capsule Take 4 capsules (800 mg total) by mouth every 12 hours for 5 days.  Qty: 40 capsule, Refills: 0         FOLLOW-UP  No follow-up provider specified.    DECISION to HOSPITALIZE / DISCHARGE:   10:34 PM    COUNSELING:  The emergency provider has spoken with the patient and discussed today's findings, in addition to providing specific details for the plan of care. Questions are answered and there is agreement with the plan.     The patient was evaluated by myself and seen by PA only.  The patient was seen under the direct supervision of attending physician who was available for consultation.    Disposition:     All results were shared with the patient, medical decision making was discussed and all questions were answered, and they agreed to assessment and plan.    Patient was HOSPITALIZED based on concerning ED workup.  At this point in time, I believe the patient has the mental capacity to make medical decisions.    FINAL IMPRESSION  1. COVID-19    2. Weakness generalized      Electronically signed  by: Sherlynn Stalls, PA-C, 11/06/2022 10:34 PM      Sherlynn Stalls, PA-C  11/06/22 2237    Electronically signed by Beather Arbour, DO at 11/09/2022  8:31 AM EST    Associated attestation - Beather Arbour, DO - 11/09/2022  8:31 AM EST  Formatting of this note is different from the original.  Images from the original note were not included.          I was available for consultation for substantive aspects of the case. This visit was performed by the [facility]  as documented.    Jackalyn Lombard, DO FACEP  11/09/2022 8:22 AM    P: [phone]

## 2022-11-06 NOTE — ED Notes (Signed)
Formatting of this note might be different from the original.  Report given to Bettra RN  Electronically signed by Joshua Andrew Ward, RN at 11/06/2022  9:47 PM EST

## 2022-11-06 NOTE — Progress Notes (Signed)
Formatting of this note is different from the original.  Meriah M Torr received onto unit at 10:49 PM and placed in room 540. Patient oriented to room and unit, bed placed in lowest position with call light in reach, call system and phone usage explained to patient and/or family present, bedside table oriented to patient preference, and patient requested items placed on table for easy access. Telemetry placed on patient and call to monitor watcher to make sure it is working correctly. Rhythm: normal sinus rhythm HR-76.  Patient resting comfortably in room. Most recent vitals are:  Vitals:    11/06/22 2248   BP: (!) 153/54   Pulse: 78   Resp: 20   Temp: 98.1 F (36.7 C)     Will continue to monitor patient and further plan of care.  Betto Vincent, RN 11/06/2022   Electronically signed by Betto Vincent, RN at 11/06/2022 10:54 PM EST

## 2022-11-06 NOTE — H&P (Signed)
Formatting of this note is different from the original.  [facility]  History and Physical    Janice Cunningham is a 88 y.o. female.    Chief Complaint: weakness     HPI: Janice Cunningham is a 88 yo female with a past medical history of HTN, dementia who presented to the ED with complaints of weakness, cough for the past few days. She has no complaints other than cough. She was in the ED earlier today and diagnosed with COVID and discharged home. She then fell at home and was brought back to the ED. There is no family present at the bedside.     ROS: System review was done and pertinent positives are mentioned in the HPI. Otherwise all the other system review are negative.    Patient Active Problem List    Diagnosis     *COVID-19      Past Medical History:   Diagnosis Date    Dementia ([place])     ESBL (extended spectrum beta-lactamase) producing bacteria infection 02/26/2021    Str. cath urine       Past Surgical History:   Procedure Laterality Date    HYSTERECTOMY         No family history on file.     Social History     Tobacco Use    Smoking status: Never    Smokeless tobacco: Never   Substance Use Topics    Alcohol use: Not Currently       Allergies   Allergen Reactions    Penicillins Rash     No outpatient medications have been marked as taking for the 11/06/22 encounter [facility] Encounter).     Prior to Admission medications    Medication Sig Start Date End Date Taking? Authorizing Provider   acetaminophen (TYLENOL EXTRA STRENGTH) 500 mg tablet Take 1 tablet (500 mg total) by mouth every 6 hours as needed. 11/06/22   Sherlynn Stalls, PA-C   amLODIPine (NORVASC) 10 mg tablet Take 1 tablet (10 mg total) by mouth daily.    Historical Provider, MD   calcium carbonate-vitamin D3 (OSCAL D) 250-125 mg-unit Tab Take 1 tablet by mouth daily.    Historical Provider, MD   erythromycin 250 mg CpDR delayed-release capsule Take 250 mg by mouth daily.  11/06/22  Historical Provider, MD   ibuprofen (MOTRIN) 600 mg  tablet Take 1 tablet (600 mg total) by mouth every 6 hours as needed for Pain for up to 7 days. 11/06/22 11/13/22  Sherlynn Stalls, PA-C       Physical Exam:  Patient Vitals for the past 24 hrs:   BP Temp Pulse Resp SpO2   11/06/22 2041 (!) 175/76 99.6 F (37.6 C) 74 20 98 %     General: Appears comfortable, in no distress.   CV: S1, S2, no murmurs, gallops, clicks or rubs.   Respiratory: Clear to auscultation. No accessory muscle use. Respiratory effort easy and unlabored.   GI/GU: Abdomen soft, non tender. + bowel sounds.  Skin: No cyanosis, edema.   Neuro: Moves all extremities spontaneously. Alert and oriented x1.     LABS:   Latest Reference Range & Units 11/06/22 15:43   Sodium 136 - 145 mmol/L 139   Potassium 3.5 - 5.1 mmol/L 3.9   Chloride 98 - 107 mmol/L 103   CO2 21 - 31 mmol/L 27   BUN 7 - 25 mg/dL 22   Creatinine 0.6 - 1.2 mg/dL 1.18   eGFR >  90 mL/min/1.39m 43   Anion Gap 7 - 16 mmol/L 9   Glucose 74 - 109 mg/dL 114 !   Calcium 8.6 - 10.2 mg/dL 9.3   AST 13 - 39 U/L 18   ALT (SGPT) 7 - 52 U/L 13   Alkaline Phosphatase 34 - 104 U/L 54   Albumin 3.5 - 5.7 g/dL 4.3   Total Bilirubin 0.3 - 1.0 mg/dl 0.5   Direct Bilirubin <=0.20 mg/dL 0.08   Lactic Acid 0.5 - 2.0 mmol/L 1.2   Protein, Total 6.0 - 8.3 g/dL 7.3   Procalcitonin <0.50 for Sepsis or <0.25 for Pneumonia ng/mL 0.10   WBC 4.0 - 10.5 K/uL 5.9   RBC 3.86 - 5.17 M/uL 4.11   HGB 12.1 - 15.8 g/dL 13.1   HCT 35.8 - 46.5 % 38.4   MCV 85.0 - 99.0 fl 93.4   MCH 28.4 - 33.4 pg 31.8   MCHC 31.1 - 37.0 g/dL 34.1   RDW 11.7 - 15.2 % 13.5   Platelet count 154 - 393 K/uL 184   Neutrophils % % 71.1   Lymphocytes % % 15.8   Monocytes % % 12.9   Eosinophils % % 0.0   Basophils % % 0.2   Neutrophils Absolute 2.0 - 7.3 K/uL 4.2   Lymphocytes Absolute 0.8 - 3.6 K/uL 0.9   Monocytes Absolute 0.3 - 0.9 K/uL 0.8   Eosinophils Absolute 0.0 - 0.4 K/uL 0.0   Basophils Absolute 0.0 - 0.1 K/uL 0.0   PROCALCITONIN  Rpt     12 LEAD EKG:  No results found for this visit on  11/06/22.    Imaging:  EXAM: PORTABLE AP CHEST X-RAY    INDICATION: altered mental status,        COMPARISON: none     FINDINGS:      There is no focal consolidation, pleural effusion, or pneumothorax. The cardiomediastinal silhouette is normal. The visible bony thorax is intact.    IMPRESSION:     No acute pulmonary disease.    Assessment/Plan:  COVID-19. + 11/24. Cont isolation precautions. CXR NAD. Not requiring oxygen. Inflammatory markers pending.   Generalized weakness. Secondary to above. PT/OT eval and tx. TSH pending.   HTN. Cont amlodipine.  Dementia. Supportive care.   DVT prophylaxis. Lovenox.   Code Status. Total support.     Home med rec reviewed.   Plan of care discussed with patient.   NARx: 000, 000, 000.    KRobyne AskewHUlysses APRN-CNP  11/06/2022  Electronically signed by SHilbert Corrigan MD at 11/07/2022  8:05 AM EST

## 2022-11-06 NOTE — ED Notes (Signed)
Formatting of this note might be different from the original.  Pt refused to change into gown. EMS reports patient cannot bear weight on legs and is too weak to walk  Electronically signed by Adele Barthel Ward, RN at 11/06/2022  8:45 PM EST

## 2022-11-06 NOTE — ED Notes (Signed)
Formatting of this note might be different from the original.  Bed: ER 22  Expected date:   Expected time:   Means of arrival:   Comments:  5  Electronically signed by Hassell Halim, RN at 11/06/2022  3:11 PM EST

## 2022-11-06 NOTE — ED Notes (Signed)
Formatting of this note might be different from the original.  Awaiting family to return to review discharge with caregivers. Vella Raring, RN    Electronically signed by Vella Raring, RN at 11/06/2022  5:16 PM EST

## 2022-11-06 NOTE — ED Notes (Signed)
Formatting of this note might be different from the original.  Pt refused to take off sweater. When attempting to establish IV access, 20g IV was found in the right AC. EMS did not report placement of an IV. Pt was previously seen earlier today in emergency room. IV flushed, blood return noted, and sterile cap placed.   Electronically signed by Adele Barthel Ward, RN at 11/06/2022 10:02 PM EST

## 2022-11-06 NOTE — ED Triage Notes (Signed)
Formatting of this note might be different from the original.  Pt arrives by EMS with c/o generalized weakness. Pt's daughter called EMS concerned that she may have a UTI. Pt has also been more lethargic. Pt is very hard of hearing but has no complaints. Pt has dementia at baseline.   Electronically signed by Jannett Celestine, RN at 11/06/2022  3:13 PM EST

## 2022-11-06 NOTE — ED Triage Notes (Signed)
Formatting of this note might be different from the original.  Pt had an unwitnessed fall at home. Pt fell in bathroom from standing. Pt is at baseline GCS of 14 per family and EMS. Pt is alert and responsive  Electronically signed by Carrie Sue Hertenstein, RN at 11/06/2022  8:34 PM EST

## 2022-11-06 NOTE — ED Notes (Signed)
Formatting of this note might be different from the original.  Pt refused to take off sweater. When attempting to establish IV access, 20g IV was found in the right AC. EMS did not report placement of an IV. Pt was previously seen earlier today in emergency room. IV flushed, blood return noted, and sterile cap placed.   Electronically signed by Joshua Andrew Ward, RN at 11/06/2022 10:02 PM EST

## 2022-11-06 NOTE — ED Notes (Signed)
Formatting of this note is different from the original.  ED to Inpatient SBAR Hand-off  Situation  Nashonda LADELL BEY   [id number]    88 y.o.             [facility] ED ER 12/ER 12       Allergies   Allergen Reactions    Penicillins Rash     Isolation Status: No active isolations    Ideal body weight: 59.3 kg (130 lb 11.7 oz)            Code Status:No Order   Are any Caregivers Present? no  Patient arrived from: Home    Chief Complaint   Patient presents with    Fall       Final diagnoses:   COVID-19   Weakness generalized       No orders to display       Abnormal Labs Reviewed - No data to display    Background  There is no problem list on file for this patient.    Assessment  BP: 175/76 (11/24 2041)  Temp: 99.6 F (37.6 C) (11/24 2041)  Pulse: 74 (11/24 2041)  Resp: 20 (11/24 2041)  SpO2: 98 % (11/24 2041)  FiO2 (%): --  O2 Flow Rate (L/min): --  Cardiac (WDL): --  Cardiac Rhythm: --   Pain Scale:       Pain Assessment:      Mental status:      Patient currently on oxygen: no    Active LDAs (ex. Type/location):     Fall Risk: 90     Braden Scale Score:       Restraints:        Pertinent or High Risk Medications/Drips: no   If Yes, please provide details:  Pending Blood Product Administration: no    Recommendation  Next Steps (if known): Admit to hospital  Plan for Discharge (if known):   Additional Comments:     Sending RN to call Receiving RN with additional information?: yes  If any further questions, please call Sending RN   Electronically signed by Adele Barthel Ward, RN at 11/06/2022  8:47 PM EST

## 2022-11-06 NOTE — Progress Notes (Signed)
Formatting of this note is different from the original.  Janice Cunningham received onto unit at 10:49 PM and placed in room 540. Patient oriented to room and unit, bed placed in lowest position with call light in reach, call system and phone usage explained to patient and/or family present, bedside table oriented to patient preference, and patient requested items placed on table for easy access. Telemetry placed on patient and call to monitor watcher to make sure it is working correctly. Rhythm: normal sinus rhythm HR-76.  Patient resting comfortably in room. Most recent vitals are:  Vitals:    11/06/22 2248   BP: (!) 153/54   Pulse: 78   Resp: 20   Temp: 98.1 F (36.7 C)     Will continue to monitor patient and further plan of care.  Delcie Roch, RN 11/06/2022   Electronically signed by Delcie Roch, RN at 11/06/2022 10:54 PM EST

## 2022-11-06 NOTE — ED Notes (Signed)
Formatting of this note might be different from the original.  Pt refused to change into gown. EMS reports patient cannot bear weight on legs and is too weak to walk  Electronically signed by Joshua Andrew Ward, RN at 11/06/2022  8:45 PM EST

## 2022-11-06 NOTE — ED Provider Notes (Signed)
Formatting of this note is different from the original.  Images from the original note were not included.          I was available for consultation for substantive aspects of the case. This visit was performed by the [facility] as documented.    Marianne, [place]  11/06/22 1848    Electronically signed by Avelino Leeds, DO at 11/06/2022  6:48 PM EST

## 2022-11-06 NOTE — ED Provider Notes (Signed)
Formatting of this note is different from the original.  Images from the original note were not included.      This patient was seen in full proper PPE following [facility] protocol    CHIEF COMPLAINT  Chief Complaint   Patient presents with    Fall     HPI  Janice Cunningham is a 88 y.o. female via EMS with a past medical history of dementia who presents to the Emergency Department for evaluation of fall. The patient denies pain.  EMS reports that the patient had unwitnessed fall, was found down in the bathroom at home.  Patient was diagnosed with COVID today.    PCP: SAMUEL C DOCENA, MD    REVIEW OF SYSTEMS  Unable to obtain due to dementia.    PAST MEDICAL HISTORY  Past Medical History:   Diagnosis Date    Dementia ([facility])     ESBL (extended spectrum beta-lactamase) producing bacteria infection 02/26/2021    Str. cath urine     FAMILY HISTORY  No family history on file.    SOCIAL HISTORY  Social History     Socioeconomic History    Marital status: Widowed   Tobacco Use    Smoking status: Never    Smokeless tobacco: Never   Vaping Use    Vaping Use: Never used   Substance and Sexual Activity    Alcohol use: Not Currently    Drug use: Never    Sexual activity: Not Currently     SURGICAL HISTORY  Past Surgical History:   Procedure Laterality Date    HYSTERECTOMY       CURRENT MEDICATIONS  No outpatient medications have been marked as taking for the 11/06/22 encounter (Hospital Encounter).     ALLERGIES  Allergies   Allergen Reactions    Penicillins Rash     PHYSICAL EXAM  VITAL SIGNS: BP (!) 175/76   Pulse 74   Temp 99.6 F (37.6 C)   Resp 20   SpO2 98%  ,   Constitutional:  alert and  not in acute distress  CDC criteria BMI < 18.5 = underweight ; 18.5 to <25 = normal, 25.0 to <30= overweight range. 30 - 39 = obese . 40 or greater = severe obesity  HEENT: Normocephalic, Atraumatic, Bilateral external ears normal, Oropharynx moist, No oral exudates, uvula midline, nose normal.   Eyes: PERRLA, EOMI,  Conjunctiva normal, No discharge. No scleral icterus.  Neck: Normal range of motion, No tenderness, Supple, No stridor.  Lymphatic: No lymphadenopathy noted in the anterior or posterior cervical chains.   Cardiovascular: Normal heart rate, Normal rhythm, No audible murmurs, gallops or rubs.   Thorax & Lungs: No respiratory distress, Normal breath sounds, No wheezing, no chest tenderness.  Abdomen: Soft, No abdominal tenderness, no guarding, rigidity or peritoneal signs, No masses, No pulsatile masses, not distended, bowel sounds normal.  Skin: Warm, Dry, No erythema, No obvious rashes noted.   Back: No tenderness, No CVA tenderness.   Genitalia: defer exam  Rectal: deferred  Extremities: Intact distal pulses, No edema, No tenderness, No cyanosis, No clubbing. Distal neurovascular and motor function is intact.   Musculoskeletal: Good range of motion in all major joints. No tenderness to palpation or major deformities noted. No palpable crepitance, step-off, instability or deformity.  Neurologic: GCS 15, no meningeal signs, alert & confused, Normal motor function, Normal sensory function, No focal deficits noted.     5/5 strength on the left upper extremity  5/5   strength on the right upper extremity  5/5 strength on the left lower extremity  5/5 strength on the right lower extremity    Sensory: Equals bilaterally to light touch    RADIOLOGY/PROCEDURES  I, R CAVEY, PA-C, have independently reviewed the following labs:    No data to display    Non-plain film images such as CT, Ultrasound and MRI are read by the radiologist.   No orders to display     Nursing Notes were reviewed    Se[place] studies reviewed. (See chart for details)    Patient was seen, history and physical exam was performed. Patient remains stable here in the emergency department. Patient's laboratory values, imaging studies and physical examination findings were reviewed and were  reassuring.    Medications - No data to display     Patient is a 88 y.o. female who presents with the above-noted report.  Patient appears  alert and  not in acute distress and vital signs stable on arrival.  Breathing is easy, regular, nonlabored.  No scalp or cervical tenderness or ecchymosis or swelling.  No tenderness to palpation of the back.  No abdominal tenderness.  No focal neurologic deficit.  Electronic medical records reviewed. Diagnostic considerations included but not limited to: Generalized weakness, fall, TBI.  Patient has no clinical findings suggesting significant injury from her fall this evening.  Family has reported that they are unable to take care of her at home in her weakened state.  Consultation made to hospitalist service.  Patient accepted for hospitalization.      DISCHARGE MEDICATIONS  Current Discharge Medication List       START taking these medications    Details   molnupiravir 200 mg capsule Take 4 capsules (800 mg total) by mouth every 12 hours for 5 days.  Qty: 40 capsule, Refills: 0         FOLLOW-UP  No follow-up provider specified.    DECISION to HOSPITALIZE / DISCHARGE:   10:34 PM    COUNSELING:  The emergency provider has spoken with the patient and discussed today's findings, in addition to providing specific details for the plan of care. Questions are answered and there is agreement with the plan.     The patient was evaluated by myself and seen by PA only.  The patient was seen under the direct supervision of attending physician who was available for consultation.    Disposition:     All results were shared with the patient, medical decision making was discussed and all questions were answered, and they agreed to assessment and plan.    Patient was HOSPITALIZED based on concerning ED workup.  At this point in time, I believe the patient has the mental capacity to make medical decisions.    FINAL IMPRESSION  1. COVID-19    2. Weakness generalized      Electronically signed  by: Sherlynn Stalls, PA-C, 11/06/2022 10:34 PM      Sherlynn Stalls, PA-C  11/06/22 2237    Electronically signed by Beather Arbour, DO at 11/09/2022  8:31 AM EST    Associated attestation - Beather Arbour, DO - 11/09/2022  8:31 AM EST  Formatting of this note is different from the original.  Images from the original note were not included.          I was available for consultation for substantive aspects of the case. This visit was performed by the [facility]  as documented.    Christopher McIntosh, DO FACEP  11/09/2022 8:22 AM    P: [phone]

## 2022-11-06 NOTE — ED Notes (Signed)
Formatting of this note might be different from the original.  Bed: ER 12  Expected date:   Expected time:   Means of arrival:   Comments:  hanover  Electronically signed by Joseph M Hudson, RN at 11/06/2022  8:32 PM EST

## 2022-11-06 NOTE — ED Triage Notes (Signed)
Formatting of this note might be different from the original.  Pt had an unwitnessed fall at home. Pt fell in bathroom from standing. Pt is at baseline GCS of 14 per family and EMS. Pt is alert and responsive  Electronically signed by Genevieve Norlander, RN at 11/06/2022  8:34 PM EST

## 2022-11-06 NOTE — H&P (Signed)
Formatting of this note is different from the original.  [facility]  History and Physical    Janice Cunningham is a 88 y.o. female.    Chief Complaint: weakness     HPI: Janice Cunningham is a 88 yo female with a past medical history of HTN, dementia who presented to the ED with complaints of weakness, cough for the past few days. She has no complaints other than cough. She was in the ED earlier today and diagnosed with COVID and discharged home. She then fell at home and was brought back to the ED. There is no family present at the bedside.     ROS: System review was done and pertinent positives are mentioned in the HPI. Otherwise all the other system review are negative.    Patient Active Problem List    Diagnosis     *COVID-19      Past Medical History:   Diagnosis Date    Dementia ([facility])     ESBL (extended spectrum beta-lactamase) producing bacteria infection 02/26/2021    Str. cath urine       Past Surgical History:   Procedure Laterality Date    HYSTERECTOMY         No family history on file.     Social History     Tobacco Use    Smoking status: Never    Smokeless tobacco: Never   Substance Use Topics    Alcohol use: Not Currently       Allergies   Allergen Reactions    Penicillins Rash     No outpatient medications have been marked as taking for the 11/06/22 encounter (Hospital Encounter).     Prior to Admission medications    Medication Sig Start Date End Date Taking? Authorizing Provider   acetaminophen (TYLENOL EXTRA STRENGTH) 500 mg tablet Take 1 tablet (500 mg total) by mouth every 6 hours as needed. 11/06/22   Rebecca Cavey, PA-C   amLODIPine (NORVASC) 10 mg tablet Take 1 tablet (10 mg total) by mouth daily.    Historical Provider, MD   calcium carbonate-vitamin D3 (OSCAL D) 250-125 mg-unit Tab Take 1 tablet by mouth daily.    Historical Provider, MD   erythromycin 250 mg CpDR delayed-release capsule Take 250 mg by mouth daily.  11/06/22  Historical Provider, MD   ibuprofen (MOTRIN) 600 mg  tablet Take 1 tablet (600 mg total) by mouth every 6 hours as needed for Pain for up to 7 days. 11/06/22 11/13/22  Rebecca Cavey, PA-C       Physical Exam:  Patient Vitals for the past 24 hrs:   BP Temp Pulse Resp SpO2   11/06/22 2041 (!) 175/76 99.6 F (37.6 C) 74 20 98 %     General: Appears comfortable, in no distress.   CV: S1, S2, no murmurs, gallops, clicks or rubs.   Respiratory: Clear to auscultation. No accessory muscle use. Respiratory effort easy and unlabored.   GI/GU: Abdomen soft, non tender. + bowel sounds.  Skin: No cyanosis, edema.   Neuro: Moves all extremities spontaneously. Alert and oriented x1.     LABS:   Latest Reference Range & Units 11/06/22 15:43   Sodium 136 - 145 mmol/L 139   Potassium 3.5 - 5.1 mmol/L 3.9   Chloride 98 - 107 mmol/L 103   CO2 21 - 31 mmol/L 27   BUN 7 - 25 mg/dL 22   Creatinine 0.6 - 1.2 mg/dL 1.18   eGFR >  90 mL/min/1.73m2 43   Anion Gap 7 - 16 mmol/L 9   Glucose 74 - 109 mg/dL 114 !   Calcium 8.6 - 10.2 mg/dL 9.3   AST 13 - 39 U/L 18   ALT (SGPT) 7 - 52 U/L 13   Alkaline Phosphatase 34 - 104 U/L 54   Albumin 3.5 - 5.7 g/dL 4.3   Total Bilirubin 0.3 - 1.0 mg/dl 0.5   Direct Bilirubin <=0.20 mg/dL 0.08   Lactic Acid 0.5 - 2.0 mmol/L 1.2   Protein, Total 6.0 - 8.3 g/dL 7.3   Procalcitonin <0.50 for Sepsis or <0.25 for Pneumonia ng/mL 0.10   WBC 4.0 - 10.5 K/uL 5.9   RBC 3.86 - 5.17 M/uL 4.11   HGB 12.1 - 15.8 g/dL 13.1   HCT 35.8 - 46.5 % 38.4   MCV 85.0 - 99.0 fl 93.4   MCH 28.4 - 33.4 pg 31.8   MCHC 31.1 - 37.0 g/dL 34.1   RDW 11.7 - 15.2 % 13.5   Platelet count 154 - 393 K/uL 184   Neutrophils % % 71.1   Lymphocytes % % 15.8   Monocytes % % 12.9   Eosinophils % % 0.0   Basophils % % 0.2   Neutrophils Absolute 2.0 - 7.3 K/uL 4.2   Lymphocytes Absolute 0.8 - 3.6 K/uL 0.9   Monocytes Absolute 0.3 - 0.9 K/uL 0.8   Eosinophils Absolute 0.0 - 0.4 K/uL 0.0   Basophils Absolute 0.0 - 0.1 K/uL 0.0   PROCALCITONIN  Rpt     12 LEAD EKG:  No results found for this visit on  11/06/22.    Imaging:  EXAM: PORTABLE AP CHEST X-RAY    INDICATION: altered mental status,        COMPARISON: none     FINDINGS:      There is no focal consolidation, pleural effusion, or pneumothorax. The cardiomediastinal silhouette is normal. The visible bony thorax is intact.    IMPRESSION:     No acute pulmonary disease.    Assessment/Plan:  COVID-19. + 11/24. Cont isolation precautions. CXR NAD. Not requiring oxygen. Inflammatory markers pending.   Generalized weakness. Secondary to above. PT/OT eval and tx. TSH pending.   HTN. Cont amlodipine.  Dementia. Supportive care.   DVT prophylaxis. Lovenox.   Code Status. Total support.     Home med rec reviewed.   Plan of care discussed with patient.   NARx: 000, 000, 000.    Keri Nicole Hollencamp, APRN-CNP  11/06/2022  Electronically signed by Sivani Siva Pathmarajah, MD at 11/07/2022  8:05 AM EST

## 2022-11-06 NOTE — ED Notes (Signed)
Formatting of this note might be different from the original.  Bed: ER 12  Expected date:   Expected time:   Means of arrival:   Comments:  hanover  Electronically signed by Arlys John, RN at 11/06/2022  8:32 PM EST

## 2022-11-06 NOTE — ED Provider Notes (Signed)
Formatting of this note is different from the original.  Images from the original note were not included.      This patient was seen in full proper PPE following [place]  Chief Complaint   Patient presents with    Weakness     HPI  Janice Cunningham is a 88 y.o. female who presents to the Emergency Department for evaluation of lethargy and increased confusion.  Family reports that the patient ate very little yesterday and was not able to get up and move around and slept most of the day.  She has been exposed to [place].  Patient is very hard of hearing.  She denies pain or illness.  No nausea vomiting or diarrhea.  Family is concerned for urinary tract infection.  Daughter reports that the patient is on a prn water pill which she has not needed recently.  Patient also has not needed her blood pressure medication and it has not been given recently.  Family reports that the patient has confusion at baseline which does seem to be worse over the past 24 hours.  No recent head injury.  No anticoagulant use.    PCP: Janetta Hora, MD    REVIEW OF SYSTEMS  Unable to obtain due to dementia     PAST MEDICAL HISTORY  Past Medical History:   Diagnosis Date    Dementia ([place])     ESBL (extended spectrum beta-lactamase) producing bacteria infection 02/26/2021    Str. cath urine     FAMILY HISTORY  No family history on file.    SOCIAL HISTORY  Social History     Socioeconomic History    Marital status: Widowed   Tobacco Use    Smoking status: Never    Smokeless tobacco: Never   Vaping Use    Vaping Use: Never used   Substance and Sexual Activity    Alcohol use: Not Currently    Drug use: Never    Sexual activity: Not Currently     SURGICAL HISTORY  Past Surgical History:   Procedure Laterality Date    HYSTERECTOMY       CURRENT MEDICATIONS  Outpatient Medications Marked as Taking for the 11/06/22 encounter [facility] Encounter)   Medication Sig Dispense Refill    acetaminophen (TYLENOL EXTRA  STRENGTH) 500 mg tablet Take 1 tablet (500 mg total) by mouth every 6 hours as needed. 30 tablet 0    calcium carbonate-vitamin D3 (OSCAL D) 250-125 mg-unit Tab Take 1 tablet by mouth daily.      ibuprofen (MOTRIN) 600 mg tablet Take 1 tablet (600 mg total) by mouth every 6 hours as needed for Pain for up to 7 days. 28 tablet 0     ALLERGIES  Allergies   Allergen Reactions    Penicillins Rash     PHYSICAL EXAM  VITAL SIGNS: BP (!) 171/85   Pulse 99   Temp (!) 101 F (38.3 C)   Resp 20   Ht _0  (1.676 m)   Wt 119 lb 6.4 oz (54.2 kg)   SpO2 100%   BMI 19.27 kg/m  ,   Constitutional:  alert and  not in acute distress  CDC criteria BMI < 18.5 = underweight ; 18.5 to <25 = normal, 25.0 to <30= overweight range. 8 - 31 = obese . 72 or greater = severe obesity  HEENT: Normocephalic, Atraumatic, Bilateral external ears normal, Oropharynx moist, No oral exudates, uvula midline, nose  normal.   Eyes: PERRLA, EOMI, Conjunctiva normal, No discharge. No scleral icterus.  Neck: Normal range of motion, No tenderness, Supple, No stridor.  Lymphatic: No lymphadenopathy noted in the anterior or posterior cervical chains.   Cardiovascular: Normal heart rate, Normal rhythm, No audible murmurs, gallops or rubs.   Thorax & Lungs: No respiratory distress, Normal breath sounds, No wheezing, no chest tenderness.  Abdomen: Soft, No abdominal tenderness, no guarding, rigidity or peritoneal signs, No masses, No pulsatile masses, not distended, bowel sounds normal.  Skin: Warm, Dry, No erythema, No obvious rashes noted.   Back: No tenderness, No CVA tenderness.   Genitalia: defer exam  Rectal: deferred  Extremities: Intact distal pulses, mild bilateral ankle edema, No tenderness, No cyanosis, No clubbing. Distal neurovascular and motor function is intact.   Musculoskeletal: Good range of motion in all major joints. No tenderness to palpation or major deformities noted. No palpable crepitance, step-off, instability or  deformity.  Neurologic: GCS 15, no meningeal signs, very hard of hearing even with hearing aids turned up. Alert & confused, Normal motor function, Normal sensory function, No focal deficits noted.   Psychiatric: Affect normal, no evidence of obvious psychomotor agitation. Patient maintains good eye contact.     EKG  Results for orders placed or performed during the hospital encounter of 11/06/22   EKG Standard 12 Lead   Result Value Ref Range    Heart Rate 80 bpm    RR INTERVAL 752 ms    PR Interval 143 ms    QRSD Interval 84 ms    QT Interval 414 ms    QTc Interval 477 ms    QRS Axis 75 deg    T Wave Axis 33 deg    REPORT - NORMAL ECG -     REPORT Sinus rhythm      RADIOLOGY/PROCEDURES  I, R CAVEY, PA-C, have independently reviewed the following labs:      BASIC METABOLIC PANEL - Abnormal; Notable for the following components:       Result Value Ref Range Status    Glucose 114 (*) 74 - 109 mg/dL Final    All other components within normal limits    Narrative:     KDIGO 2012 GFR Categories                    Stage Description    eGFR (mL/min/1.30m)                    G1 Normal or high    >=90                  G2 Mildly decreased   60-89                  G3a Mildly to moderately decreased 45-59                  G3b Moderately to severely decreased 30-44                  G4 Severely decreased   15-29                  G5 Kidney Failure    <15   URINE CULTURE, CONDITIONAL - Abnormal; Notable for the following components:    Urine Color Light-Yellow (*) Yellow Final    Protein Urinalysis 100 (*) Negative mg/dL Final    RBC-IrisCell 4-5 (*) 0 - 3 /hpf Final  All other components within normal limits    Narrative:     Culture of the urine specimen was not completed as criteria were not met.   SARS-COV2-2 RAPID TEST - Abnormal; Notable for the following components:    SARS-CoV-2 RNA Positive (*) Negative Final    All other components within normal limits    Narrative:     This COVID-19 assay is a rapid molecular in  vitro diagnostic test utilizing an isothermal nucleic acid amplification technology intended for the qualitative detection of nucleic acid from the SARS-CoV-2 viral RNA.                    Results are for the identification of SARS-CoV-2 RNA. The SARS-CoV-2 RNA is generally detectable in respiratory samples during the acute phase of infection. Positive results are indicative of the presence of SARS-CoV-2 RNA; clinical correlation with patient history and other diagnostic information is necessary to determine patient infection status.                      Negative results should be treated as presumptive and, if inconsistent with clinical signs and symptoms or necessary for patient management, should be tested with an alternative molecular assay. Negative results do not preclude SARS-CoV-2 infection and should not be used as the sole basis for patient management decisions. Negative results should be considered in the context of a patient's recent exposures, history, presence of clinical signs and symptoms consistent with COVID-19.                    For Use Under an Emergency Use Authorization (EUA) Only    GLUCOSE POC RESULTS - Abnormal; Notable for the following components:    POC Glucose 113 (*) 74 - 106 mg/dL Final    All other components within normal limits    Narrative:     Point of care test performed at bedside.   LIVER PROFILE PANEL - Normal   INFLUENZA A/B BY NEAR - Normal    Narrative:     The Influenza A/B molecular assay is based on nicking enzyme amplification reaction (NEAR) technology.    LACTATE/LACTIC ACID - Normal   PROCALCITONIN - Normal    Narrative:     COMMENT:                  Concentrations of <0.25 ng/mL do not exclude an infection, on account of localized infections (without systemic signs) which can be associated with such low concentrations, or a systemic infection in its initial stages (< 6 hours). Furthermore, increased procalcitonin can occur without infection. Procalcitonin  concentrations should be interpreted taking into account the patient's history (see Algorithm Guidelines below). It is recommended to retest procalcitonin within 6 to 12 hours if antibiotics are withheld and an alternate diagnosis has not been established.                     FOR ASSISTANCE IN INTERPRETING THESE RESULTS, PLEASE SEE ALGORITHMS BELOW.    BLOOD CULTURE   BLOOD CULTURE   URINE CULTURE, CONDITIONAL, NOT PERFORMED   CBC W/DIFF   POC GLUCOSE, BLOOD BY GLUCOSE MONITORING DEVICE (ACCUCHECK)     Non-plain film images such as CT, Ultrasound and MRI are read by the radiologist.   XR-CHEST PORTABLE STAT   Final Result     No acute pulmonary disease.     Electronically Signed by: Lora Paula, MD, 11/06/2022 3:56 PM  Nursing Notes were reviewed    COURSE & MEDICAL DECISION MAKING  Pertinent Labs & Imaging studies reviewed. (See chart for details)    Patient was seen, history and physical exam was performed. Patient remains stable here in the emergency department. Patient's laboratory values, imaging studies and physical examination findings were reviewed and were reassuring.    Medications   0.9 % sodium chloride infusion (125 mL/hr Intravenous New Bag 11/06/22 1634)   acetaminophen (TYLENOL) tablet 1,000 mg (1,000 mg Oral Given 11/06/22 1705)       Patient is a 88 y.o. female who presents with the above-noted report.  Patient appears healthy,  alert, and  not in acute distress and she is febrile, vital signs stable on arrival.  Breathing is easy, regular, nonlabored.  Electronic medical records reviewed. Diagnostic considerations included but not limited to: Viral upper respiratory infection with cough, bronchitis, pneumonia, UTI, COVID. Laboratory studies remarkable for COVID test positive.  Chest x-ray read by radiologist to myself shows no evidence of acute cardiopulmonary disease.  Results of diagnostic studies discussed with the patient.   Patient is recommended antipyretic, outpatient follow-up with  primary care provider.  Patient is discharged in stable condition.      DISCHARGE MEDICATIONS  New Prescriptions    ACETAMINOPHEN (TYLENOL EXTRA STRENGTH) 500 MG TABLET    Take 1 tablet (500 mg total) by mouth every 6 hours as needed.    IBUPROFEN (MOTRIN) 600 MG TABLET    Take 1 tablet (600 mg total) by mouth every 6 hours as needed for Pain for up to 7 days.     FOLLOW-UP  Janetta Hora, MD  [address]  [address]  [place] [address]  [phone]    DECISION to HOSPITALIZE / DISCHARGE:   5:38 PM    COUNSELING:  The emergency provider has spoken with the patient and discussed today's findings, in addition to providing specific details for the plan of care. Questions are answered and there is agreement with the plan.     Patient was given clear discharge and followup instructions including return to the ER immediately for worsening concerns. Patient is advised to followup with primary care physician and/or referred physician in the next one to two days or sooner if worsening and to return to the ER immediately as above with any concerns. Usual and customary treatment and medication side effects and warning signs discussed. Patient was discharged from emergency department in good condition with all questions answered and patient has demonstrated capacity to understand these instructions and to follow up. Refer to the patient's discharge summary for more information on plan and follow-up.   I have explained to the patient in appropriate terminology our work-up in the ED and their diagnosis. I have also given anticipatory guidance and expectant management of their condition as an outpatient as per my custom. They are instructed to return to the ED if their condition deteriorates in any way    The patient was evaluated by myself and seen by PA only.  The patient was seen under the direct supervision of attending physician who was available for consultation.    Disposition:     All results  were shared with the patient, medical decision making was discussed and all questions were answered, and they agreed to assessment and plan.    Patient was DISCHARGED from  the hospital. Based on the reassuring ED workup and patient's stable vital signs, I feel the patient may  be safely discharged home. At this point in time, I believe the patient has the mental capacity to make medical decisions.    FINAL IMPRESSION  1. COVID-19      Electronically signed by: Sherlynn Stalls, PA-C, 11/06/2022 5:38 PM      Sherlynn Stalls, PA-C  11/06/22 1738    Electronically signed by Avelino Leeds, DO at 11/06/2022  6:50 PM EST

## 2022-11-06 NOTE — Unmapped (Signed)
Formatting of this note is different from the original.  Images from the original note were not included.      [id number]   Coronavirus Disease 2019 (COVID-19): Overview   Coronavirus disease 2019 (COVID-19) is an illness that infects the lungs. It's caused by a type of coronavirus. The virus is called SARS-CoV-2. There are many types of coronaviruses. They are a common cause of colds and bronchitis. They can cause a lung infection called pneumonia. Symptoms can range from mild to severe. Some people have no symptoms. These types of viruses are also found in some animals.   Viruses change (mutate) all the time. The changes lead to different forms of a virus. These are called variants. COVID-19 variants may spread more easily from person to person. They may cause milder symptoms. Or they may cause more severe symptoms.    The virus spreads and infects people easily. It can infect a person more easily if they are not immune to it. The virus most often spreads through droplets of fluid that a person coughs or sneezes into the air. In some cases, you can get it from touching a surface with the virus on it and then touching your eyes, nose, or mouth.     To help prevent spreading the infection, wash your hands often, or use an alcohol-based hand sanitizer.    To learn more   For the latest from the CDC:     Go to the [facility] website     Call 800-CDC-INFO 628-270-0619)     What are the symptoms of COVID-19?   Some people have no symptoms. Some have mild symptoms. Others may have severe symptoms. This varies from person to person. Symptoms may start 2 to 14 days after contact with the virus. They can include:     Fever     Chills     Coughing     Trouble breathing or feeling short of breath     Sore throat     Stuffy or runny nose     Headache     Body aches     Tiredness     Nausea, vomiting, diarrhea, or belly pain     New loss of sense of smell or taste     What are possible complications of [id number]?   The virus  can cause an infection in the lungs. This is called pneumonia. This can lead to death in some cases. Experts are still learning more about COVID-19 problems. Problems may include:     Low blood pressure     Kidney failure     Inflammation of the brain or heart     Rashes   Some people are at higher risk for problems. This includes:     Older adults     People with heart or lung disease     People with diabetes or kidney disease     People with health conditions that limit the immune system     People who take medicines that limit the immune system   Rarely, a child may have a severe complication. This is called multisystem inflammatory syndrome in children (MIS-C). MIS-C seems to be like Kawasaki disease. This is a rare illness. It causes swelling of blood vessels and body organs. MIS can also happen in adults. But this is less common.       How is COVID-19 diagnosed?   Your healthcare provider will ask:     What symptoms you have  Where you live     If you?ve traveled recently     If you?ve had contact with sick people     If you are vaccinated against COVID-19     If you have had COVID-19   You may have 1 of these tests for COVID-19:     Viral (molecular) test. You may also hear this called a PCR or RT-PCR test. Viral tests are very accurate. A viral test looks for the genetic material (RNA) of the SARS-CoV-2 virus. There are a few ways to do this. A swab may be wiped inside your nose or throat. Or a long swab may be put into your nose down to the back of your throat. Or a sample of your saliva may be taken. Your test results may be back in 45 minutes to a few hours. This depends on the type of test. Some tests must be sent to a lab. These can take several days for the results. You can now get test kits to use at home. Some of these need a prescription. Follow the instructions in the kit closely if you use a home kit. Some kits show results quickly at home. Others must be sent to a lab for the  results.     Antigen test. This can find proteins from the SARS-CoV-2 virus. A swab may be wiped inside your nose or throat. Or a long swab may be put into your nose down to the back of your throat. Some results are back within 15 to 60 minutes. This depends on the type of test. Positive results are very accurate. But false positive results can happen. And the results can be negative even in people with COVID-19. Antigen tests are more likely to miss a COVID-19 infection than a viral (molecular) test. You may need to have a viral test if your antigen test is negative but you have symptoms of COVID-19.     Breath test.  This rapid test is not widely available at this time. It finds SARS-CoV-2 infection in the breath. The test is done at providers' offices, hospitals, and mobile testing sites.   You may have other tests if your provider thinks or confirms that you have COVID-19. These tests may include:     Antibody blood test. This type of test can show if you had the virus in the past. It shows antibodies for the virus in the blood. The accuracy of these tests varies. And they are not available everywhere. An antibody test may not show if you have an infection right now. This is because it can take up to a few weeks for your body to make antibodies. None of the antibody tests can yet be used to tell if a person is immune to the virus.     Sputum culture. If you have a wet cough, you may be asked to cough up a bit of mucus (sputum) from your lungs. This is tested for the virus. It may be tested for pneumonia.     Imaging tests. You may have a chest X-ray or CT scan.   Can you get COVID-19 again?   Yes, you can get COVID-19 more than once. You may not have immunity. You could have lost the immunity. Or you may get COVID-19 from a different strain (variant) of the virus that you are not immune to. But the COVID-19 vaccine helps lower the risk for COVID-19.     Vaccines for COVID-19   The FDA and  CDC advise  vaccines for people 6 months and older to help prevent COVID-19. The vaccines can also make the illness less severe. It can keep you from needing to go to the hospital.  And it can prevent the spread of the virus to others. No vaccine is 100% effective at preventing an illness. But getting a vaccine is important. Pregnant or breastfeeding people can have the vaccine. Updated (bivalent) mRNA vaccines from [employer] are advised. Other options are available for people who can't or won't get an mRNA vaccine. These are Novavax and J&J Janssen COVID-19 vaccines.   COVID-19 vaccines are given as a shot (injection) into the muscle. Ask your healthcare provider which vaccine is best for you and your family.   COVID-19 vaccine booster shots   People ages 52 months or older can get a COVID-19 booster shot. It's given a few months after their primary series. Boosters can help with protection against COVID-19 that may have decreased over time.   Booster advice varies by vaccine, age, health, and COVID-19 variants. People ages 43 and older and some people with a very weak immune system should get an updated (bivalent) mRNA booster. Talk with your provider about your risk and how this applies to you.     How is COVID-19 treated?   The best treatments right now are those to help your body while it fights the virus. This is called supportive care. It includes:     Rest. This helps your body fight the illness.     Fluids. Try to drink 6 to 8 glasses of fluids every day. Ask your provider which drinks are best for you. Don't have drinks with caffeine or alcohol.     Over-the-counter (OTC) medicine. These are used to help ease pain and reduce fever. Ask your provider which OTC medicine is safe for you to use.   Talk with your provider if you have confirmed COVID-19. You may qualify for medicines approved by the FDA to prevent severe COVID-19 infection.   You may need to stay in the hospital for severe illness. Your care may  include:     IV fluids. These are given through a vein. This helps to replace fluids in your body.     Oxygen. You may be given extra oxygen. Or you may be put on a breathing machine (ventilator). This is done so you get enough oxygen in your body.     Prone positioning.  Your healthcare team may regularly turn you on your stomach. This is called prone positioning. It helps increase the amount of oxygen you get to your lungs. Follow their instructions on position changes while you're in the hospital and at home.     Antiviral medicines. Antivirals stop the SARS-CoV-2 virus from spreading in the body. The FDA has approved certain antiviral medicines to treat mild to moderate COVID-19 in people who are more likely to get very sick. These treatments are not available for everyone. Talk with your provider to learn more.     Steroids or other anti-inflammatory medicines.  These are used to lessen the inflammation that some people with COVID-19 have. Inflammation can lead to more trouble breathing. It can cause other complications or death.     Monoclonal antibodies. These were once used for earlier COVID-19 strains, but monoclonal antibody treatment is not effective for the latest variant. Talk with your provider to learn more.     COVID-19 convalescent plasma. Plasma is the liquid part of blood.  People who had COVID-19 may be asked to donate plasma. This is called COVID-19 convalescent plasma. The plasma may have antibodies. Some people with COVID-19 who have very weak immune systems may benefit from this treatment. Your provider can help decide if it's right for you.     Are you at risk for COVID-19?   You are at risk for COVID-19 if any of these apply to you:     You live in or traveled to an area with cases of COVID-19     You had close contact (within 6 feet) with someone who had COVID-19   COVID-19 may be spread by people who don't show symptoms.   Date last modified: 04/06/2022      Last Reviewed Date:  08/14/2020    2000-2023 [employer]. All rights reserved. This information is not intended as a substitute for professional medical care. Always follow your healthcare professional's instructions.       Electronically signed by Sherlynn Stalls, PA-C at 11/06/2022  4:57 PM EST

## 2022-11-06 NOTE — ED Notes (Signed)
Formatting of this note is different from the original.  ED to Inpatient SBAR Hand-off  Situation  Pamla M Rouser   [id number]    88 y.o.             [facility] ED ER 12/ER 12       Allergies   Allergen Reactions    Penicillins Rash     Isolation Status: No active isolations    Ideal body weight: 59.3 kg (130 lb 11.7 oz)            Code Status:No Order   Are any Caregivers Present? no  Patient arrived from: Home    Chief Complaint   Patient presents with    Fall       Final diagnoses:   COVID-19   Weakness generalized       No orders to display       Abnormal Labs Reviewed - No data to display    Background  There is no problem list on file for this patient.    Assessment  BP: 175/76 (11/24 2041)  Temp: 99.6 F (37.6 C) (11/24 2041)  Pulse: 74 (11/24 2041)  Resp: 20 (11/24 2041)  SpO2: 98 % (11/24 2041)  FiO2 (%): --  O2 Flow Rate (L/min): --  Cardiac (WDL): --  Cardiac Rhythm: --   Pain Scale:       Pain Assessment:      Mental status:      Patient currently on oxygen: no    Active LDAs (ex. Type/location):     Fall Risk: 90     Braden Scale Score:       Restraints:        Pertinent or High Risk Medications/Drips: no   If Yes, please provide details:  Pending Blood Product Administration: no    Recommendation  Next Steps (if known): Admit to hospital  Plan for Discharge (if known):   Additional Comments:     Sending RN to call Receiving RN with additional information?: yes  If any further questions, please call Sending RN   Electronically signed by Joshua Andrew Ward, RN at 11/06/2022  8:47 PM EST

## 2022-11-07 NOTE — Progress Notes (Signed)
Formatting of this note might be different from the original.   Physical Therapy Evaluation Attempt    Admit date:  11/06/2022          Today's Date: 11/07/2022      Patient Name/MR#:  Janice Cunningham     [id number]   Current Room:  F5540/F5540-A   Admitting Diagnosis: Weakness generalized [R53.1]  COVID-19 [U07.1]    Admitting Provider: Ranjit Katneni, MD       Evaluation Attempt  Date Initial Eval Attempted: 11/07/22  Time Initial Eval Attempted: 0956  Reason Eval Not Completed: Therapy medically contraindicated at current time (Per communication with nursing, pt is awaiting consult for possible hip and wrist injury following recent unwitnessed fall. PT/OT eval was requested to be held this date until further evaluation.)  Electronically signed by Maile Noel Sampson, PT, DPT at 11/07/2022  9:59 AM EST

## 2022-11-07 NOTE — Progress Notes (Signed)
Formatting of this note might be different from the original.  Images from the original note were not included.  4 Eyes in 4 Hours   Admission/Transfer Wound Evaluation of Pressure Points    All pressure points below have been assessed     If Pressure injury present, please complete the following:    Location of abnormal finding: Skin assessment completed, no pressure areas noted, dry skin all over the body.    Name of 2nd RN performing assessment: Teresa Griffith    Electronically signed by Betto Vincent, RN at 11/07/2022 12:02 AM EST

## 2022-11-07 NOTE — Progress Notes (Signed)
Formatting of this note is different from the original.  Hospitalist Progress Note    Patient Name:  Janice Cunningham     Date:  11/07/2022    Admitted complain:  SOB    Active Problem/s:  COVID infection    Subjective:  Pt was seen in the room  Low grade fever, T max 100.6. She is on RA. Family wondering about remdezvir.  Pt is in the COVID isolation unit. To minimize the exposure,pt was examined with some limitations.  Objective:  Physical Exam:    BP (!) 153/54   Pulse 86   Temp (!) 100.6 F (38.1 C)   Resp 20   Ht 5\' 3"  (1.6 m)   Wt 112 lb 7 oz (51 kg)   SpO2 (!) 90%   BMI 19.92 kg/m   BP (!) 153/54   Pulse 86   Temp (!) 100.6 F (38.1 C)   Resp 20   Ht 5\' 3"  (1.6 m)   Wt 112 lb 7 oz (51 kg)   SpO2 (!) 90%   BMI 19.92 kg/m   General appearance: alert.  Lungs: clear to auscultation bilaterally  Heart: regular rate and rhythm, S1, S2 normal, no murmur, click, rub or gallop  Abdomen: soft, non-tender; bowel sounds normal; no masses,  no organomegaly  Extremities: extremities normal, atraumatic, no cyanosis or edema  Neurologic: Grossly normal    Labs:   CBC:   Recent Labs     11/06/22  1543 11/07/22  0602   WBC 5.9 7.1   HEMOGLOBIN 13.1 12.5   HCT 38.4 36.4   MCV 93.4 93.2   PLT 184 172     BMP:   Recent Labs     11/06/22  1543 11/07/22  0602   NA 139 139   K 3.9 3.6   CL 103 104   CO2 27 25   BUN 22 24   CREATININE 1.18 1.16     LIVER PROFILE:   Recent Labs     11/06/22  1543 11/07/22  0602   AST 18 20   ALT 13 13   BILIDIR 0.08  --      PT/INR:  No results found for: "PROTIME", "INR"  PTT:  No results found for: "APTT"  UA: No results for input(s): "NITRITE", "COLORU", "PHUR", "MUCUS", "TRICHOMONAS", "YEAST", "BACTERIA", "SPECGRAV", "UROBILINOGEN", "GLUCOSEU", "KETONESU", "AMORPHOUS" in the last 72 hours.    Invalid input(s): "LABCAST", "WBCUA", "RBCUA", "CLARITYU", "LEUKOCYTESUR", "BILIRUBINUR", "BLOODU"    Imaging:  XR-CHEST PORTABLE STAT    Result Date: 11/06/2022  IMPRESSION: No acute pulmonary  disease. Electronically Signed by: Lora Paula, MD, 11/06/2022 3:56 PM      Assessment:  Principal Problem:    COVID-19      Plan:    COVID Infection.  Tested positive : 11/06/22  Admitted  to [facility] isolation unit  Placed on Vitamin C, Vitamin D, & Zinc   Pulmonary  consulted  HTN  Continue  norvasc.  Dementia  Fall at home  Diarrhea  Advanced age  DVT prophylaxis.  Patient was placed on lovenox subcutaneous bid.  DC Planning.  PT/OT/SS to see for the d/c planning.    High risk for clinical decline    Electronic Signature:  Hilbert Corrigan, MD 11/07/2022 8:06 AM     Due to the current situation with the COVID - 19 pandemic, to minimize the exposure, the chart in Epic was reviewed. Based on the chart review, discussion with nursing staff &  pulmonary service medical decision was made.        Electronically signed by Wynona Luna, MD at 11/07/2022 12:22 PM EST

## 2022-11-07 NOTE — Progress Notes (Signed)
Formatting of this note might be different from the original.   Physical Therapy Evaluation Attempt    Admit date:  11/06/2022          Today's Date: 11/07/2022      Patient Name/MR#:  Janice Cunningham     [id number]   Current Room:  Q0347/Q2595-G   Admitting Diagnosis: Weakness generalized [R53.1]  COVID-19 [U07.1]    Admitting Provider: Helyn Numbers, MD       Evaluation Attempt  Date Initial Eval Attempted: 11/07/22  Time Initial Eval Attempted: 0956  Reason Eval Not Completed: Therapy medically contraindicated at current time (Per communication with nursing, pt is awaiting consult for possible hip and wrist injury following recent unwitnessed fall. PT/OT eval was requested to be held this date until further evaluation.)  Electronically signed by Thurston Pounds, PT, DPT at 11/07/2022  9:59 AM EST

## 2022-11-07 NOTE — Progress Notes (Signed)
Formatting of this note might be different from the original.   Occupational Therapy Evaluation Attempt    Admit date:  11/06/2022          Today's Date: 11/07/2022      Patient Name/MR#:  Janice Cunningham     [id number]   Current Room:  Q6834/H9622-W   Admitting Diagnosis: Weakness generalized [R53.1]  COVID-19 [U07.1]    Admitting Provider: Helyn Numbers, MD       Evaluation Attempt  Date Initial Eval Attempted: 11/07/22  Time Initial Eval Attempted: 0956  Reason Eval Not Completed: Therapy medically contraindicated at current time (OT order received and chart reviewed. Per collaboration with RN, pt and family are concerned for possible wrist and/or hip injury s/p unwitnessed fall at home. RN reported pt awaiting consult and requested OT HOLD evaluation at this time.)  Electronically signed by Lezlie Octave, MOT, OTR/L at 11/07/2022 11:48 AM EST

## 2022-11-07 NOTE — Progress Notes (Signed)
Formatting of this note might be different from the original.  This nurse started on telemetry monitoring with continuous pulse Oximetry as patient is covid positive but pt is confused and ripping off the tele. and don't want to keep it on, pt is weak , this nurse notified above situations to the hospitalist Syed Hussain asked whether she needs any antibiotic or iv fluids ordered to keep for close observation tonight, will continue to monitor.  Electronically signed by Betto Vincent, RN at 11/07/2022  1:14 AM EST

## 2022-11-07 NOTE — Progress Notes (Signed)
Formatting of this note might be different from the original.  This nurse started on telemetry monitoring with continuous pulse Oximetry as patient is covid positive but pt is confused and ripping off the tele. and don't want to keep it on, pt is weak , this nurse notified above situations to the hospitalist Lorenda Peck asked whether she needs any antibiotic or iv fluids ordered to keep for close observation tonight, will continue to monitor.  Electronically signed by Delcie Roch, RN at 11/07/2022  1:14 AM EST

## 2022-11-07 NOTE — Care Plan (Signed)
Formatting of this note might be different from the original.  End of Shift and Plan of Care Summary    Patient resting quietly in bed so far this shift, VSS, no signs or symptoms of acute distress at this time.   Respirations even and unlabored noted.    Pain controlled with PRN pain medication.   Patient alert and oriented x 1 with intermittent confusion. IV SL.   Patient bedrest.  Patient safety maintained, remained free from falls, bed locked and in the lowest position, bed alarm on and functioning properly. Head of bed side rails up x 2.   Patient denies any needs at this present time.Call light within reach.   Discharge planning: Patient from home and plans to discharge TBD, will continue to monitor.     Goals per Patient Condition   Fall Prevention Plan: Patient will remain free from falls. See the Daily cares/safety flowsheet for intervention documentation.  Skin Integrity Plan: Patient skin integrity maintained. See Integumentary flowsheet for intervention documentation.  Isolation Precautions in Use: Isolation precautions in place. See Daily cares/safety flowsheet for intervention documentation.  DVT Prophylaxis in Use: Patient will remain free from DVT. See Daily cares/safety flowsheet for intervention documentation.  Pain Management Plan: Patient is being monitored for pain and response to treatment. See Vital Signs Complex Assessment flowsheet and MAR for intervention documentation.      Goals/Plan for shift  Patient/Family stated goal for shift: hydration  Nursing goal for shift: control pain and infection, free from fall, get rest  Plan: administer meds as ordered, fall precautions    Goals/Plan for Hospital Stay  Patient/Family stated goal for hospital stay: feel better  Nursing goal for hospital stay: control infection and pain, safety  Plan administer meds as ordered    Electronically signed by Betto Vincent, RN at 11/07/2022  6:45 AM EST

## 2022-11-07 NOTE — Progress Notes (Signed)
Formatting of this note might be different from the original.   Occupational Therapy Evaluation Attempt    Admit date:  11/06/2022          Today's Date: 11/07/2022      Patient Name/MR#:  Shatera M Gindlesperger     [id number]   Current Room:  F5540/F5540-A   Admitting Diagnosis: Weakness generalized [R53.1]  COVID-19 [U07.1]    Admitting Provider: Ranjit Katneni, MD       Evaluation Attempt  Date Initial Eval Attempted: 11/07/22  Time Initial Eval Attempted: 0956  Reason Eval Not Completed: Therapy medically contraindicated at current time (OT order received and chart reviewed. Per collaboration with RN, pt and family are concerned for possible wrist and/or hip injury s/p unwitnessed fall at home. RN reported pt awaiting consult and requested OT HOLD evaluation at this time.)  Electronically signed by Marie Cate Rogan, MOT, OTR/L at 11/07/2022 11:48 AM EST

## 2022-11-07 NOTE — Care Plan (Signed)
Formatting of this note might be different from the original.  End of Shift and Plan of Care Summary        Patient resting quietly in bed so far this shift, VSS, Patient aggitated this am and ripped IV out and took gown off and stated she did not want to be at hospital MD notified one time dose of Haldol ordered with benefit, respirations even and unlabored noted. O2 sats range from 94-100% on RA. Patient alert and oriented x 2, IV Saline locked, Patient denies any pain at this time, Patient safety maintained,  remained free from falls, bed locked and in the lowest position, bed alarm on and functioning properly, head of bed side rails up x 2, Patient from home and plans to return there upon discharge. Patient denies any needs at this present time, will continue to monitor.        Goals per Patient Condition   Fall Prevention Plan: Patient will remain free from falls. See the Daily cares/safety flowsheet for intervention documentation.  Skin Integrity Plan: Patient skin integrity maintained. See Integumentary flowsheet for intervention documentation.  Isolation Precautions in Use: Isolation precautions in place. See Daily cares/safety flowsheet for intervention documentation.  DVT Prophylaxis in Use: Patient will remain free from DVT. See Daily cares/safety flowsheet for intervention documentation.  Pain Management Plan: Patient is being monitored for pain and response to treatment. See Vital Signs Complex Assessment flowsheet and MAR for intervention documentation.      Goals/Plan for shift  Patient/Family stated goal for shift: to feel better  Nursing goal for shift: infection control, pain management  Plan: Administer medications per MD orders and hourly rounding    Goals/Plan for Hospital Stay  Patient/Family stated goal for hospital stay: to feel better  Nursing goal for hospital stay: infection control and pain management  Plan Administer medications per MD orders and hourly rounding    Electronically signed  by Waynetta Sandy, RN at 11/07/2022  8:49 PM EST

## 2022-11-07 NOTE — Progress Notes (Signed)
Formatting of this note might be different from the original.  Images from the original note were not included.  4 Eyes in 4 Hours   Admission/Transfer Wound Evaluation of Pressure Points    All pressure points below have been assessed     If Pressure injury present, please complete the following:    Location of abnormal finding: Skin assessment completed, no pressure areas noted, dry skin all over the body.    Name of 2nd RN performing assessment: Riki Sheer    Electronically signed by Delcie Roch, RN at 11/07/2022 12:02 AM EST

## 2022-11-07 NOTE — Care Plan (Signed)
Formatting of this note might be different from the original.  End of Shift and Plan of Care Summary    Patient resting quietly in bed so far this shift, VSS, no signs or symptoms of acute distress at this time.   Respirations even and unlabored noted.    Pain controlled with PRN pain medication.   Patient alert and oriented x 1 with intermittent confusion. IV SL.   Patient bedrest.  Patient safety maintained, remained free from falls, bed locked and in the lowest position, bed alarm on and functioning properly. Head of bed side rails up x 2.   Patient denies any needs at this present time.Call light within reach.   Discharge planning: Patient from home and plans to discharge TBD, will continue to monitor.     Goals per Patient Condition   Fall Prevention Plan: Patient will remain free from falls. See the Daily cares/safety flowsheet for intervention documentation.  Skin Integrity Plan: Patient skin integrity maintained. See Integumentary flowsheet for intervention documentation.  Isolation Precautions in Use: Isolation precautions in place. See Daily cares/safety flowsheet for intervention documentation.  DVT Prophylaxis in Use: Patient will remain free from DVT. See Daily cares/safety flowsheet for intervention documentation.  Pain Management Plan: Patient is being monitored for pain and response to treatment. See Vital Signs Complex Assessment flowsheet and MAR for intervention documentation.      Goals/Plan for shift  Patient/Family stated goal for shift: hydration  Nursing goal for shift: control pain and infection, free from fall, get rest  Plan: administer meds as ordered, fall precautions    Goals/Plan for Hospital Stay  Patient/Family stated goal for hospital stay: feel better  Nursing goal for hospital stay: control infection and pain, safety  Plan administer meds as ordered    Electronically signed by Delcie Roch, RN at 11/07/2022  6:45 AM EST

## 2022-11-07 NOTE — H&P (Signed)
Formatting of this note is different from the original.  Images from the original note were not included.  TRAUMA HISTORY AND PHYSICAL    Name:  Janice Cunningham  Date/Time of Admission: 11/06/2022  8:32 PM   CSN: [id number]  Attending Provider: Sofie Rower Pathmarajah,*   Room/Bed:  J1914/N8295-A  DOB: 07-06-1926  88 y.o.      Subjective:     TRAUMATIC EVENT: Fall    Mode of Arrival: EMS     []  Trauma l      []  Trauma ll   [x]  Trauma Consult   []   Trauma transfer from:      HPI:  Janice Cunningham is a 88 y.o. female who arrived to [facility] Emergency Department  following a fall.  Patient is known to have dementia and was evaluated for fall.  Patient denies any pain.  Patient was an unwitnessed fall.  She was recently diagnosed with COVID today.  She was found in the bathroom.  Denies any loss of conscious.  Denies any other issues.    The patient was seen at the request of the ER for trauma consultation.     Primary Care Physician:  Jackson Latino, MD     Allergies:    Allergies   Allergen Reactions    Penicillins Rash     On 11/06/22 upon admission patient's daughter confirmed that pt has no allergies.       Outpatient Meds:  Medications Prior to Admission   Medication Sig Dispense Refill Last Dose    acetaminophen (TYLENOL EXTRA STRENGTH) 500 mg tablet Take 1 tablet (500 mg total) by mouth every 6 hours as needed. 30 tablet 0     amLODIPine (NORVASC) 10 mg tablet Take 1 tablet (10 mg total) by mouth daily. (Patient not taking: Reported on 11/06/2022)   Not Taking    calcium carbonate-vitamin D3 (OSCAL D) 250-125 mg-unit Tab Take 1 tablet by mouth daily.       ibuprofen (MOTRIN) 600 mg tablet Take 1 tablet (600 mg total) by mouth every 6 hours as needed for Pain for up to 7 days. 28 tablet 0     molnupiravir 200 mg capsule Take 4 capsules (800 mg total) by mouth every 12 hours for 5 days. 40 capsule 0      Past Medical History:  Past Medical History:   Diagnosis Date    Dementia (HCC)     ESBL (extended  spectrum beta-lactamase) producing bacteria infection 02/26/2021    Str. cath urine       Past Surgical History:  Past Surgical History:   Procedure Laterality Date    HYSTERECTOMY         Social History:  Social History     Socioeconomic History    Marital status: Widowed     Spouse name: Not on file    Number of children: Not on file    Years of education: Not on file    Highest education level: Not on file   Occupational History    Not on file   Tobacco Use    Smoking status: Never    Smokeless tobacco: Never   Vaping Use    Vaping Use: Never used   Substance and Sexual Activity    Alcohol use: Not Currently    Drug use: Never    Sexual activity: Not Currently   Other Topics Concern    Not on file   Social History Narrative  Not on file     Social Determinants of Health     Financial Resource Strain: Not on file   Food Insecurity: Not on file   Transportation Needs: Not on file   Physical Activity: Not on file   Stress: Not on file   Social Connections: Not on file   Intimate Partner Violence: Not on file   Housing Stability: Not on file     Family History:  History reviewed. No pertinent family history.    Review of Systems:    Review of Systems   Constitutional: Negative.    HENT: Negative.     Eyes: Negative.    Respiratory: Negative.     Cardiovascular: Negative.    Gastrointestinal: Negative.    Genitourinary: Negative.    Skin: Negative.    Neurological: Negative.    Endo/Heme/Allergies: Negative.    Psychiatric/Behavioral: Negative.       Objective:     Vital Signs:  Vitals:    11/07/22 0900   BP: (!) 148/78   Pulse: 84   Resp: 18   Temp: 98.6 F (37 C)     Primary Survey:     Airway: Intact B/P: BP: (!) 148/78   Breathing: Unlabored Pulse: Pulse: 84   Circulation: +2 distal peripheral pulses Respiration: Resp: 18   Neuro: Alert and sensory function intact SaO2: SpO2: 92 %   GCS:  Eye opening: 4 - Opens eyes on own   Verbal:  5 - Alert and oriented   Motor:  6 - Follows simple motor commands   GCS  Total: 15    Temp: Temp: 98.6 F (37 C)     Secondary Survey:     General Appearance no distress, alert, cooperative   Eyes negative   Ears, Nose, Mouth, Throat nml ext inspection   Neck No cervical spine bony tenderness, crepitance, or stepoff, No nuchal rigidity, Normal range of motion, Trachea midline, and No masses   Chest clear to auscultation, no wheezes, rales or rhonchi, symmetric air entry   Heart normal rate, regular rhythm, normal S1, S2, no murmurs, rubs, clicks or gallops   Abdomen soft, non-tender, without masses or organomegaly   Genitourinary Female not done   Genitourinary Female not done   Musculoskeletal no joint tenderness, deformity or swelling   Back full range of motion, no tenderness, palpable spasm or pain on motion   Skin none   Neurological alert, oriented, normal speech, no focal findings or movement disorder noted   Osteopathic No somatic dysfunction or background levels.     FOCUSED ASSESSMENT WITH SONOGRAPHY FOR TRAUMA (FAST).    INDICATION:  Blunt thoracoabdominal trauma    Labs:   CBC:  Recent Labs   Lab 11/06/22  1543 11/07/22  0602   WBC 5.9 7.1   HEMOGLOBIN 13.1 12.5   HCT 38.4 36.4   PLT 184 172   RBC 4.11 3.91   MCV 93.4 93.2   MCHC 34.1 34.3   MCH 31.8 32.0   RDW 13.5 13.6   NEUTROPHILS 71.1 74.8   MONOCYTES 12.9 10.2    BMP:  Recent Labs   Lab 11/06/22  1543 11/07/22  0602   NA 139 139   K 3.9 3.6   CL 103 104   CO2 27 25   BUN 22 24   CREATININE 1.18 1.16   GLU 114* 107   CALCIUM 9.3 8.9   ALBUMIN 4.3 3.8       Liver Function/Coags/Cardiac Results:  Recent Labs   Lab 11/06/22  1543 11/07/22  0602   ALT 13 13   AST 18 20   BILIDIR 0.08  --     ABG:  No results for input(s): "PH", "PCO2", "PO2", "O2CONTENT", "HCO3ART", "BASEEXCESS" in the last 168 hours.      Radioogy:   XR-WRIST LEFT 2 VIEWS    Result Date: 11/07/2022  IMPRESSION: No acute osseous abnormality. Mild to moderate degenerative joint disease.    Electronically Signed by: Vallarie Mare, MD, 11/07/2022 2:04 PM      XR-FOREARM LEFT 2 VIEWS    Result Date: 11/07/2022  IMPRESSION: No acute osseous abnormality or significant degenerative joint disease.    Electronically Signed by: Vallarie Mare, MD, 11/07/2022 2:03 PM     XR-WRIST RIGHT 2 VIEWS    Result Date: 11/07/2022  IMPRESSION: No acute osseous abnormality. Mild degenerative joint disease.    Electronically Signed by: Vallarie Mare, MD, 11/07/2022 2:02 PM     XR-FOREARM RIGHT 2 VIEWS    Result Date: 11/07/2022  IMPRESSION: No acute osseous abnormality or significant degenerative joint disease.    Electronically Signed by: Vallarie Mare, MD, 11/07/2022 2:02 PM     XR-HIP BIL 3-4 VIEWS W/ PELVIS    Result Date: 11/07/2022  IMPRESSION: No acute osseous abnormality. Mild hip joint space narrowing most consistent with degenerative osteoarthritis.    Electronically Signed by: Vallarie Mare, MD, 11/07/2022 2:01 PM     XR-CHEST PORTABLE STAT    Result Date: 11/06/2022  IMPRESSION: No acute pulmonary disease. Electronically Signed by: Gerald Leitz, MD, 11/06/2022 3:56 PM      Interventions:   Intubation:   Central Access:   Laceration repair:  Chest tube (s):    Thoracotomy:   DPL :   Other:      Consults:     Ortho:    ENT:     Neurosurgery:    Plastics:    Opthal:    OB/Gyn:    Urology:    Other:       Injury Assessment:     88 y.o. female who presents with:    1.  Fall  2.  Bilateral hip pain  3.  Bilateral forearm and wrist pain    SBIRT/PTSD     In the past 3 months have you had more than:  (Men) 4 drinks in one day?  (Women) 3 drinks in one day?  (Age 81+) 3 drinks in one day?    In the last 12 months, did you ever drink alcohol or use drugs more than you meant to?    In the last 12 months, did you ever feel you should cut down on your drinking or drug use?    In the last 12 months, did you use:  Marijuana  Another recreational drug  A prescription pain killer, stimulant or sedative more than recommended?    No to the above  Sometimes things happen to people that are unusually  or especially frightening, horrible, or traumatic. For example:   A serious accident or fire  A physical or sexual assault or abuse  An earthquake or flood  A war  Seeing someone be killed or seriously injured  Having a loved one die through homicide or suicide     Have you ever experienced this kind of event?  No    If no, screen total= 0. Please stop here.  If yes, please answer the questions below.    In the past  month, have you...  Had nightmares about the event(s) or thought about the event(s) when you did not want to?  No  Tried hard not to think about the event(s) or went out of your way to avoid situations that reminded you of the event(s)?   No  Been constantly on guard, watchful, or easily startled?  No  Felt numb or detached from people, activities, or your surroundings?  No  Felt guilty or unable to stop blaming yourself or others for the event(s) or any problems the event(s) may have caused?   No    Plan:     Afebrile vital signs are stable.  Patient is complaining of bilateral wrist pain as well as bilateral hip pain.  Will get imaging of all that area.  Will do CT head neck just to make sure not missing anything as well.  Tertiary exam tomorrow.    Medications   amLODIPine (NORVASC) tablet 5 mg (5 mg Oral Not Given 11/07/22 0012)   chlorhexidine gluconate 2 % cloth (1 each Topical Given 11/07/22 0456)   enoxaparin (LOVENOX) injection 30 mg (30 mg Subcutaneous Given 11/07/22 0942)   acetaminophen (TYLENOL) tablet 500-1,000 mg (1,000 mg Oral Given 11/07/22 1497)     Or   acetaminophen (TYLENOL) tablet 650 mg ( Oral See Alternative 11/07/22 0620)   ondansetron (ZOFRAN) injection 4 mg (has no administration in time range)     Or   ondansetron (ZOFRAN-ODT) disintegrating tablet 4 mg (has no administration in time range)   benzonatate (TESSALON) capsule 200 mg (has no administration in time range)   guaiFENesin (MUCINEX) extended-release tablet 600 mg (600 mg Oral Given 11/07/22 0941)   docusate (COLACE)  capsule 100 mg (has no administration in time range)   ascorbic acid (vitamin C) (VITAMIN C) tablet 500 mg (500 mg Oral Given 11/07/22 0941)   cholecalciferol (VITAMIN D3) capsule 5,000 Units (5,000 Units Oral Given 11/07/22 0941)   zinc sulfate (ZINCATE) capsule 220 mg (220 mg Oral Given 11/07/22 0941)   potassium chloride (KLOR-CON M) ER tablet 40 mEq (40 mEq Oral Given 11/07/22 0941)   haloperidol lactate (HALDOL) injection 1 mg (1 mg Intramuscular Given 11/07/22 0939)     Electronically signed by:  Rolm Gala, DO  11/07/2022    Electronically signed by Rolm Gala, DO at 11/07/2022  3:05 PM EST

## 2022-11-07 NOTE — H&P (Signed)
Formatting of this note is different from the original.  Images from the original note were not included.  TRAUMA HISTORY AND PHYSICAL    Name:  Janice Cunningham  Date/Time of Admission: 11/06/2022  8:32 PM   CSN: [id number]  Attending Provider: Sivani Siva Pathmarajah,*   Room/Bed:  F5540/F5540-A  DOB: 12/20/1925  88 y.o.      Subjective:     TRAUMATIC EVENT: Fall    Mode of Arrival: EMS     [] Trauma l      [] Trauma ll   [x] Trauma Consult   []  Trauma transfer from:      HPI:  Janice Cunningham is a 88 y.o. female who arrived to [facility] Emergency Department  following a fall.  Patient is known to have dementia and was evaluated for fall.  Patient denies any pain.  Patient was an unwitnessed fall.  She was recently diagnosed with COVID today.  She was found in the bathroom.  Denies any loss of conscious.  Denies any other issues.    The patient was seen at the request of the ER for trauma consultation.     Primary Care Physician:  SAMUEL C DOCENA, MD     Allergies:    Allergies   Allergen Reactions    Penicillins Rash     On 11/06/22 upon admission patient's daughter confirmed that pt has no allergies.       Outpatient Meds:  Medications Prior to Admission   Medication Sig Dispense Refill Last Dose    acetaminophen (TYLENOL EXTRA STRENGTH) 500 mg tablet Take 1 tablet (500 mg total) by mouth every 6 hours as needed. 30 tablet 0     amLODIPine (NORVASC) 10 mg tablet Take 1 tablet (10 mg total) by mouth daily. (Patient not taking: Reported on 11/06/2022)   Not Taking    calcium carbonate-vitamin D3 (OSCAL D) 250-125 mg-unit Tab Take 1 tablet by mouth daily.       ibuprofen (MOTRIN) 600 mg tablet Take 1 tablet (600 mg total) by mouth every 6 hours as needed for Pain for up to 7 days. 28 tablet 0     molnupiravir 200 mg capsule Take 4 capsules (800 mg total) by mouth every 12 hours for 5 days. 40 capsule 0      Past Medical History:  Past Medical History:   Diagnosis Date    Dementia (HCC)     ESBL (extended  spectrum beta-lactamase) producing bacteria infection 02/26/2021    Str. cath urine       Past Surgical History:  Past Surgical History:   Procedure Laterality Date    HYSTERECTOMY         Social History:  Social History     Socioeconomic History    Marital status: Widowed     Spouse name: Not on file    Number of children: Not on file    Years of education: Not on file    Highest education level: Not on file   Occupational History    Not on file   Tobacco Use    Smoking status: Never    Smokeless tobacco: Never   Vaping Use    Vaping Use: Never used   Substance and Sexual Activity    Alcohol use: Not Currently    Drug use: Never    Sexual activity: Not Currently   Other Topics Concern    Not on file   Social History Narrative      Not on file     Social Determinants of Health     Financial Resource Strain: Not on file   Food Insecurity: Not on file   Transportation Needs: Not on file   Physical Activity: Not on file   Stress: Not on file   Social Connections: Not on file   Intimate Partner Violence: Not on file   Housing Stability: Not on file     Family History:  History reviewed. No pertinent family history.    Review of Systems:    Review of Systems   Constitutional: Negative.    HENT: Negative.     Eyes: Negative.    Respiratory: Negative.     Cardiovascular: Negative.    Gastrointestinal: Negative.    Genitourinary: Negative.    Skin: Negative.    Neurological: Negative.    Endo/Heme/Allergies: Negative.    Psychiatric/Behavioral: Negative.       Objective:     Vital Signs:  Vitals:    11/07/22 0900   BP: (!) 148/78   Pulse: 84   Resp: 18   Temp: 98.6 F (37 C)     Primary Survey:     Airway: Intact B/P: BP: (!) 148/78   Breathing: Unlabored Pulse: Pulse: 84   Circulation: +2 distal peripheral pulses Respiration: Resp: 18   Neuro: Alert and sensory function intact SaO2: SpO2: 92 %   GCS:  Eye opening: 4 - Opens eyes on own   Verbal:  5 - Alert and oriented   Motor:  6 - Follows simple motor commands   GCS  Total: 15    Temp: Temp: 98.6 F (37 C)     Secondary Survey:     General Appearance no distress, alert, cooperative   Eyes negative   Ears, Nose, Mouth, Throat nml ext inspection   Neck No cervical spine bony tenderness, crepitance, or stepoff, No nuchal rigidity, Normal range of motion, Trachea midline, and No masses   Chest clear to auscultation, no wheezes, rales or rhonchi, symmetric air entry   Heart normal rate, regular rhythm, normal S1, S2, no murmurs, rubs, clicks or gallops   Abdomen soft, non-tender, without masses or organomegaly   Genitourinary Female not done   Genitourinary Female not done   Musculoskeletal no joint tenderness, deformity or swelling   Back full range of motion, no tenderness, palpable spasm or pain on motion   Skin none   Neurological alert, oriented, normal speech, no focal findings or movement disorder noted   Osteopathic No somatic dysfunction or background levels.     FOCUSED ASSESSMENT WITH SONOGRAPHY FOR TRAUMA (FAST).    INDICATION:  Blunt thoracoabdominal trauma    Labs:   CBC:  Recent Labs   Lab 11/06/22  1543 11/07/22  0602   WBC 5.9 7.1   HEMOGLOBIN 13.1 12.5   HCT 38.4 36.4   PLT 184 172   RBC 4.11 3.91   MCV 93.4 93.2   MCHC 34.1 34.3   MCH 31.8 32.0   RDW 13.5 13.6   NEUTROPHILS 71.1 74.8   MONOCYTES 12.9 10.2    BMP:  Recent Labs   Lab 11/06/22  1543 11/07/22  0602   NA 139 139   K 3.9 3.6   CL 103 104   CO2 27 25   BUN 22 24   CREATININE 1.18 1.16   GLU 114* 107   CALCIUM 9.3 8.9   ALBUMIN 4.3 3.8       Liver Function/Coags/Cardiac Results:  Recent Labs   Lab 11/06/22  1543 11/07/22  0602   ALT 13 13   AST 18 20   BILIDIR 0.08  --     ABG:  No results for input(s): "PH", "PCO2", "PO2", "O2CONTENT", "HCO3ART", "BASEEXCESS" in the last 168 hours.      Radioogy:   XR-WRIST LEFT 2 VIEWS    Result Date: 11/07/2022  IMPRESSION: No acute osseous abnormality. Mild to moderate degenerative joint disease.    Electronically Signed by: Vallarie Mare, MD, 11/07/2022 2:04 PM      XR-FOREARM LEFT 2 VIEWS    Result Date: 11/07/2022  IMPRESSION: No acute osseous abnormality or significant degenerative joint disease.    Electronically Signed by: Vallarie Mare, MD, 11/07/2022 2:03 PM     XR-WRIST RIGHT 2 VIEWS    Result Date: 11/07/2022  IMPRESSION: No acute osseous abnormality. Mild degenerative joint disease.    Electronically Signed by: Vallarie Mare, MD, 11/07/2022 2:02 PM     XR-FOREARM RIGHT 2 VIEWS    Result Date: 11/07/2022  IMPRESSION: No acute osseous abnormality or significant degenerative joint disease.    Electronically Signed by: Vallarie Mare, MD, 11/07/2022 2:02 PM     XR-HIP BIL 3-4 VIEWS W/ PELVIS    Result Date: 11/07/2022  IMPRESSION: No acute osseous abnormality. Mild hip joint space narrowing most consistent with degenerative osteoarthritis.    Electronically Signed by: Vallarie Mare, MD, 11/07/2022 2:01 PM     XR-CHEST PORTABLE STAT    Result Date: 11/06/2022  IMPRESSION: No acute pulmonary disease. Electronically Signed by: Gerald Leitz, MD, 11/06/2022 3:56 PM      Interventions:   Intubation:   Central Access:   Laceration repair:  Chest tube (s):    Thoracotomy:   DPL :   Other:      Consults:     Ortho:    ENT:     Neurosurgery:    Plastics:    Opthal:    OB/Gyn:    Urology:    Other:       Injury Assessment:     88 y.o. female who presents with:    1.  Fall  2.  Bilateral hip pain  3.  Bilateral forearm and wrist pain    SBIRT/PTSD     In the past 3 months have you had more than:  (Men) 4 drinks in one day?  (Women) 3 drinks in one day?  (Age 81+) 3 drinks in one day?    In the last 12 months, did you ever drink alcohol or use drugs more than you meant to?    In the last 12 months, did you ever feel you should cut down on your drinking or drug use?    In the last 12 months, did you use:  Marijuana  Another recreational drug  A prescription pain killer, stimulant or sedative more than recommended?    No to the above  Sometimes things happen to people that are unusually  or especially frightening, horrible, or traumatic. For example:   A serious accident or fire  A physical or sexual assault or abuse  An earthquake or flood  A war  Seeing someone be killed or seriously injured  Having a loved one die through homicide or suicide     Have you ever experienced this kind of event?  No    If no, screen total= 0. Please stop here.  If yes, please answer the questions below.    In the past  month, have you...  Had nightmares about the event(s) or thought about the event(s) when you did not want to?  No  Tried hard not to think about the event(s) or went out of your way to avoid situations that reminded you of the event(s)?   No  Been constantly on guard, watchful, or easily startled?  No  Felt numb or detached from people, activities, or your surroundings?  No  Felt guilty or unable to stop blaming yourself or others for the event(s) or any problems the event(s) may have caused?   No    Plan:     Afebrile vital signs are stable.  Patient is complaining of bilateral wrist pain as well as bilateral hip pain.  Will get imaging of all that area.  Will do CT head neck just to make sure not missing anything as well.  Tertiary exam tomorrow.    Medications   amLODIPine (NORVASC) tablet 5 mg (5 mg Oral Not Given 11/07/22 0012)   chlorhexidine gluconate 2 % cloth (1 each Topical Given 11/07/22 0456)   enoxaparin (LOVENOX) injection 30 mg (30 mg Subcutaneous Given 11/07/22 0942)   acetaminophen (TYLENOL) tablet 500-1,000 mg (1,000 mg Oral Given 11/07/22 1497)     Or   acetaminophen (TYLENOL) tablet 650 mg ( Oral See Alternative 11/07/22 0620)   ondansetron (ZOFRAN) injection 4 mg (has no administration in time range)     Or   ondansetron (ZOFRAN-ODT) disintegrating tablet 4 mg (has no administration in time range)   benzonatate (TESSALON) capsule 200 mg (has no administration in time range)   guaiFENesin (MUCINEX) extended-release tablet 600 mg (600 mg Oral Given 11/07/22 0941)   docusate (COLACE)  capsule 100 mg (has no administration in time range)   ascorbic acid (vitamin C) (VITAMIN C) tablet 500 mg (500 mg Oral Given 11/07/22 0941)   cholecalciferol (VITAMIN D3) capsule 5,000 Units (5,000 Units Oral Given 11/07/22 0941)   zinc sulfate (ZINCATE) capsule 220 mg (220 mg Oral Given 11/07/22 0941)   potassium chloride (KLOR-CON M) ER tablet 40 mEq (40 mEq Oral Given 11/07/22 0941)   haloperidol lactate (HALDOL) injection 1 mg (1 mg Intramuscular Given 11/07/22 0939)     Electronically signed by:  Rolm Gala, DO  11/07/2022    Electronically signed by Rolm Gala, DO at 11/07/2022  3:05 PM EST

## 2022-11-07 NOTE — Care Plan (Signed)
Formatting of this note might be different from the original.  End of Shift and Plan of Care Summary        Patient resting quietly in bed so far this shift, VSS, Patient aggitated this am and ripped IV out and took gown off and stated she did not want to be at hospital MD notified one time dose of Haldol ordered with benefit, respirations even and unlabored noted. O2 sats range from 94-100% on RA. Patient alert and oriented x 2, IV Saline locked, Patient denies any pain at this time, Patient safety maintained,  remained free from falls, bed locked and in the lowest position, bed alarm on and functioning properly, head of bed side rails up x 2, Patient from home and plans to return there upon discharge. Patient denies any needs at this present time, will continue to monitor.        Goals per Patient Condition   Fall Prevention Plan: Patient will remain free from falls. See the Daily cares/safety flowsheet for intervention documentation.  Skin Integrity Plan: Patient skin integrity maintained. See Integumentary flowsheet for intervention documentation.  Isolation Precautions in Use: Isolation precautions in place. See Daily cares/safety flowsheet for intervention documentation.  DVT Prophylaxis in Use: Patient will remain free from DVT. See Daily cares/safety flowsheet for intervention documentation.  Pain Management Plan: Patient is being monitored for pain and response to treatment. See Vital Signs Complex Assessment flowsheet and MAR for intervention documentation.      Goals/Plan for shift  Patient/Family stated goal for shift: to feel better  Nursing goal for shift: infection control, pain management  Plan: Administer medications per MD orders and hourly rounding    Goals/Plan for Hospital Stay  Patient/Family stated goal for hospital stay: to feel better  Nursing goal for hospital stay: infection control and pain management  Plan Administer medications per MD orders and hourly rounding    Electronically signed  by Angela Smith, RN at 11/07/2022  8:49 PM EST

## 2022-11-08 NOTE — Consults (Signed)
Associated Order(s): IP CONSULT TO PULMONOLOGY  Formatting of this note is different from the original.  [facility]    Nida Boatman, D.O., F.C.C.P.  Kallie Locks, M.D., Ph.D., F.C.C.P.  Marinda Elk, M.D., F.C.C.P.   Hollace Hayward, M.D., F.C.C.P.    Consult Note    Name:  Janice Cunningham Date/Time of Admission: 11/06/2022  8:32 PM    CSN: [id number] Attending Provider: Modesta Messing Pathmarajah,*   Room/Bed: W4132/G4010-U DOB: 1926/03/19 Age: 88 y.o.     REASON FOR CONSULT  COVID 19 Infection, cough, pulmonary congestion recent fall, background dementia    HPI  I was asked by Dr. Hilbert Corrigan,*  to see Janice Cunningham in consultation.  History was obtained from   [x]  patient   []  patient's family   [x]  chart review   [x]   nursing      [x]  Dr. Elenora Fender    Janice Cunningham is a 88 y.o. female who presents with COVID 19 Infection.  Patient has underlying dementia, UTI.  She reported generalized weakness and malaise.  She was thought to have developed UTI.  She came to the ER on 11/24 and tested positive for SARS-CoV-2 virus.  She was diagnosed with COVID-19.    Chest x-ray showed clear lung fields without acute infiltrates.  Patient was discharged home and then went to the bathroom and sustained a fall.  She presented back to the ER with temperature 100.6 F.  She was noticed to have some coughing episodes with mild congestion.  Trauma evaluation was performed and she did not have any evidence of acute fractures.    She was admitted for further management.  Patient did not require supplemental oxygen.  Labs showed WBC 7.2 without lymphopenia.  Hemoglobin 12.5 and platelets 172.  Sodium 139, potassium 3.6, BUN 24 creatinine 1.16.  Blood glucose was 107.  Urinalysis was negative    Patient has received the following for COVID-19:    Decadron: n/a  Convalescent Plasma: n/a  Remdesivir: n/a  Baricitinib: n/a    COVID-19 Symptom Onset: Few days prior to admission.   She reports COVID 19 exposure  COVID-19 Positive Testing: 11/06/22    COVID-19 Vaccination: Yes, multiple (received last COVID booster in 2022)    Family history is reviewed and noncontributory unless otherwise noted above in the HPI    Pertinent Imaging/Testing      [x]  Outside records were carefully reviewed.    REVIEW OF SYSTEMS    See HPI for further details. The rest of the complete review of systems [] otherwise negative   [x]  unobtainable due to mental state or inability to communicate.    PAST MEDICAL HISTORY  Past Medical History:   Diagnosis Date    Dementia ([place])     ESBL (extended spectrum beta-lactamase) producing bacteria infection 02/26/2021    Str. cath urine     SURGICAL HISTORY  Past Surgical History:   Procedure Laterality Date    HYSTERECTOMY       SOCIAL HISTORY  Social History     Socioeconomic History    Marital status: Widowed   Tobacco Use    Smoking status: Never    Smokeless tobacco: Never   Vaping Use    Vaping Use: Never used   Substance and Sexual Activity    Alcohol use: Not Currently    Drug use: Never    Sexual activity: Not Currently     FAMILY HISTORY  Noncontributory    CURRENT  MEDICATIONS  No outpatient medications have been marked as taking for the 11/06/22 encounter [facility] Encounter).      amLODIPine  5 mg Oral DAILY    ascorbic acid (vitamin C)  500 mg Oral DAILY    chlorhexidine gluconate   Topical DAILY AT 12 NOON    cholecalciferol  5,000 Units Oral DAILY    enoxaparin (LOVENOX) injection (30 or 40 mg DVT Prophylaxis)  30 mg Subcutaneous TWO TIMES DAILY    guaiFENesin  600 mg Oral 2 times per day    magnesium sulfate  2 g Intravenous ONCE    potassium chloride  40 mEq Oral EVERY 6 HOURS    zinc sulfate  220 mg Oral DAILY       ALLERGIES  Allergies   Allergen Reactions    Penicillins Rash     On 11/06/22 upon admission patient's daughter confirmed that pt has no allergies.     PHYSICAL EXAM    VITAL SIGNS: BP (!) 150/77   Pulse 86   Temp 98.1 F (36.7 C)   Resp 18    Ht 5\' 3"  (1.6 m)   Wt 112 lb 7 oz (51 kg)   SpO2 92%   BMI 19.92 kg/m          SOME/MOST/ALL PART OF THE EXAMINATION WERE DEFERRED TO REDUCE EXPOSURE RISK AND TO HELP CONSERVE PPE DURING THE COVID 19 PANDEMIC     Constitutional: Well developed, Well nourished.  Frail pleasant elderly female, sitting comfortably in chair, hard of hearing, background dementia  HENT: Head normocephalic, atraumatic; Bilateral external ears normal; Mucous membranes pink and moist; oropharynx without exudates.  Eyes: PERRL, Conjunctiva normal, No discharge. Anicteric sclera.  Neck: Supple, No tenderness, Trachea midline, no JVD   Lymphatic: No lymphadenopathy noted.   Cardiovascular: Normal heart rate, Normal rhythm, No murmurs, No rubs, No gallops.   Thorax & Lungs: Fair air entry, mild congested cough,  No wheezes. No rhonchi.  Few crackles/adventitial sounds that improved with coughing.  Symmetrical chest expansion, no accessory muscle use  Abdomen: Bowel sounds normal, Soft, No tenderness, No distension. No organomegaly, No pulsatile masses.   Skin: Warm, Dry, No erythema, No rash.   Extremities: No edema of lower extremities. No tenderness. No cyanosis. No clubbing.   Musculoskeletal: Reduced range of motion in all major joints. No tenderness to palpation or major deformities noted.  Wasting of small muscles of the hand  Neurologic: Awake, answers in single words, no gross focal deficits noted.   Psychiatric: background dementia    INTAKE/OUTPUT:  I/O last 3 completed shifts:  In: 240 [P.O.:240]  Out: 0   No intake/output data recorded.    LABS  Hematology  Recent Labs     11/06/22  1543 11/07/22  0602 11/08/22  0548   WBC 5.9 7.1 7.2   HEMOGLOBIN 13.1 12.5 12.9   HCT 38.4 36.4 37.7   PLT 184 172 160     No results for input(s): "PROTIME", "INR", "PTT" in the last 72 hours.    Chemistries  Recent Labs     11/06/22  1543 11/07/22  0602 11/08/22  0548   NA 139 139 137   K 3.9 3.6 3.4*   CL 103 104 103   CO2 27 25 24    BUN 22 24 23     CREATININE 1.18 1.16 1.13   GLU 114* 107 96     Recent Labs     11/06/22  1543 11/07/22  0602 11/08/22  0093   CALCIUM 9.3 8.9 8.5*   MG  --   --  1.8     LFTs  Recent Labs     11/06/22  1543 11/07/22  0602   AST 18 20   ALT 13 13     No results for input(s): "AMYLASE", "LIPASE" in the last 72 hours.    Arterial Blood Gasses  No results for input(s): "PH", "PCO2", "PO2", "O2SATURATION", "INSPIREDO2" in the last 72 hours.    Cardiac Enzymes  No results for input(s): "CREATPHOSKIN", "CKMBTOTAL", "CKMBISOSTOTA", "TROPONINI", "MYOGLOBIN", "BNP" in the last 72 hours.    Microbiology  Results for orders placed or performed during the hospital encounter of 11/06/22   Blood Culture    Specimen: Arm-Left; Blood   Result Value Ref Range    Blood Culture No Growth at 18-24 hrs.      Imaging  Chest imaging was personally reviewed.  CT-ANGIO HEAD&NECK W AND/OR WO CON W/POST IMG    Result Date: 11/07/2022  PROCEDURE: 1.  CT ANGIOGRAPHY HEAD WITH CONTRAST 2.  CT ANGIOGRAPHY NECK WITH CONTRAST INDICATION: Head trauma, minor (Age >= 65y), syncope, fall COMPARISON: CT head without contrast from the same day TECHNIQUE: CT angiogram of the head and neck was performed according to standard protocol. Axial images, multiplanar reformatted images, and maximum intensity projection images were reviewed for CT angiographic technique. Up-to-date CT equipment and radiation dose reduction techniques were employed. IV contrast: 65 mL Omnipaque 350 FINDINGS: Non-angiographic findings: There is biapical pleural thickening. CTA Neck: The visualized aortic arch appears normal. The configuration of the brachiocephalic vessels is typical. The innominate artery and both subclavian arteries appear normal. There is atherosclerotic disease at the bilateral carotid bifurcations extending into the origin of the internal carotid arteries without significant focal stenosis. Left external carotid artery demonstrates 50% stenosis at its origin. The cervical  internal carotid arteries appear normal. The cervical vertebral arteries appear normal. Left vertebral artery is dominant. CTA Head: The distal internal carotid arteries appear normal. The anterior and middle cerebral arteries appear normal. Distal right vertebral artery terminates as the PICA. The basilar artery and posterior cerebral arteries appear normal. No aneurysms, vascular occlusions, or intracranial stenoses are identified.     1.  No aneurysms, vascular occlusions, or intracranial stenoses identified. 2.  No significant stenosis in the extracranial vertebral or internal carotid arteries. 50% stenosis of left external carotid artery origin. Electronically Signed by: Gerald Leitz, MD, 11/07/2022 5:10 PM     XR-WRIST LEFT 2 VIEWS    Result Date: 11/07/2022  INDICATION: Fall at home. Left wrist pain. LEFT WRIST 2 views were performed. No comparison. Mineralization appears within normal limits. No evidence of acute fracture. No dislocation identified. Mild to moderate narrowing of radiocarpal space visualized. Moderate narrowing identified at first carpometacarpal articulation with marginal osteophyte formation noted. No pathologic calcification identified. Soft tissues are unremarkable.        No acute osseous abnormality. Mild to moderate degenerative joint disease.    Electronically Signed by: Vallarie Mare, MD, 11/07/2022 2:04 PM     XR-FOREARM LEFT 2 VIEWS    Result Date: 11/07/2022  INDICATION: Fall at home. Pain at left forearm. LEFT FOREARM 2 views were performed. No comparison. Mineralization appears within normal limits. No evidence of acute fracture. No dislocation identified. No significant degenerative joint space narrowing. No pathologic calcification identified. Soft tissues are unremarkable.        No acute osseous abnormality or significant degenerative joint disease.  Electronically Signed by: Vallarie Mare, MD, 11/07/2022 2:03 PM     XR-WRIST RIGHT 2 VIEWS    Result Date:  11/07/2022  INDICATION: Follow-up home. Pain in right wrist. RIGHT WRIST 2 views were performed. No comparison. Mineralization appears within normal limits. No evidence of acute fracture. No dislocation identified. Mild radiocarpal joint space narrowing identified. Mild narrowing at first carpometacarpal articulation also noted. No pathologic calcification identified. Soft tissues are unremarkable.        No acute osseous abnormality. Mild degenerative joint disease.    Electronically Signed by: Vallarie Mare, MD, 11/07/2022 2:02 PM     XR-FOREARM RIGHT 2 VIEWS    Result Date: 11/07/2022  INDICATION: Fall at home. Pain RIGHT FOREARM 2 views were performed. No comparison. Mineralization appears within normal limits. No evidence of acute fracture. No dislocation identified. No significant degenerative joint space narrowing. No pathologic calcification identified. Soft tissues are unremarkable.        No acute osseous abnormality or significant degenerative joint disease.    Electronically Signed by: Vallarie Mare, MD, 11/07/2022 2:02 PM     XR-HIP BIL 3-4 VIEWS W/ PELVIS    Result Date: 11/07/2022  INDICATION: Traumatic injury. Pain. PELVIS AND BILATERAL HIPS 3 views were performed. No comparison. Mineralization appears within normal limits. No evidence of acute fracture. No dislocation identified. Mild bilateral hip degenerative joint space narrowing is identified. No pathologic calcification identified. Soft tissues are unremarkable.        No acute osseous abnormality. Mild hip joint space narrowing most consistent with degenerative osteoarthritis.    Electronically Signed by: Vallarie Mare, MD, 11/07/2022 2:01 PM     XR-CHEST PORTABLE STAT    Result Date: 11/06/2022  EXAM: PORTABLE AP CHEST X-RAY INDICATION: altered mental status,    COMPARISON: none FINDINGS:     There is no focal consolidation, pleural effusion, or pneumothorax. The cardiomediastinal silhouette is normal. The visible bony thorax is intact.     No  acute pulmonary disease. Electronically Signed by: Gerald Leitz, MD, 11/06/2022 3:56 PM     ASSESSMENT  COVID-19 viral infection  Cough with mild pulmonary congestion  Generalized weakness and malaise  Recent fall  Dementia  Dehydration  Mild acute kidney injury  Advanced age  Hypertension    PLAN  Patient seen and evaluated chart reviewed  Agree with admission  Patient remains on room air.  Okay to use oxygen as needed to keep SPO2 greater than 89%.  No indication for Decadron, convalescent plasma, or Remdesivir.  She may have been a good candidate for outpatient Paxlovid.  Aspiration precautions  Tylenol for fever.  Avoid ibuprofen  Self proning was encouraged  Cough suppressants as needed   Pulmonary toilet  Vitamin D vitamin C and zinc will be continued  Follow inflammatory markers  Nutritional rehabilitation  Encourage p.o. hydration.  Gentle IV hydration can be started with close monitoring of renal function   DVT/GI prophylaxis  Hopefully patient can be discharged home to follow isolation guidelines.  Recommend COVID-19 booster dose 60-90 days after most recent infection    I personally reviewed chart, labs, and imaging, interviewed and examined the patient, formulated assessment and plan.    Case reviewed with   patient, nursing staff and Dr. Dory Peru.    Thank you very much for the opportunity to participate in care of Aubrea M Tsou.    Electronically signed: Oluwole O.A. Mila Palmer, MD 11/08/2022  11:00 AM   Electronically signed by Milus Glazier. Mila Palmer, MD at  11/08/2022 11:17 AM EST

## 2022-11-08 NOTE — Progress Notes (Signed)
Formatting of this note is different from the original.  Images from the original note were not included.  TRAUMA SURGERY PROGRESS NOTE    Name:  Janice Cunningham Date/Time of Admission: 11/06/2022  8:32 PM   CSN: [id number]  Attending Provider: Sofie Rower Pathmarajah,*   Room/Bed:  I7867/E7209-O DOB: 24-Mar-1926 88 y.o.      11/08/2022       Post Trauma Day #   2  Post Operative Day * No surgery found *     Events over last 24 hrs     Patient seen and examined.  Patient seen in her room is doing very well.  She is currently being managed for her COVID.    Current Meds:  Scheduled Meds:   amLODIPine  5 mg Oral DAILY    ascorbic acid (vitamin C)  500 mg Oral DAILY    chlorhexidine gluconate   Topical DAILY AT 12 NOON    cholecalciferol  5,000 Units Oral DAILY    enoxaparin (LOVENOX) injection (30 or 40 mg DVT Prophylaxis)  30 mg Subcutaneous TWO TIMES DAILY    guaiFENesin  600 mg Oral 2 times per day    magnesium sulfate  2 g Intravenous ONCE    potassium chloride  40 mEq Oral EVERY 6 HOURS    zinc sulfate  220 mg Oral DAILY     Continuous Infusions:  PRN Meds:.acetaminophen **OR** acetaminophen, benzonatate, docusate, ondansetron **OR** ondansetron     Objective:     Vital Signs:  Vitals:    11/08/22 1001   BP: (!) 150/77   Pulse:    Resp:    Temp:        I/O:    I/O last 3 completed shifts:  In: 240 [P.O.:240]  Out: 0    No intake/output data recorded.     PHYSICAL EXAM    Neurological GCS:  15   HEENT NCAT, Pupils PEARLA, Airway Patent    CSPINE Supple, No C spine pain, Painless ROM. No gross deformities or step offs.   Respiratory CTAB, Atraumatic, = Rise/Fall   Cardiovascular RRR   GI / Pelvis  Soft, NT, +BS / Atraumatic Stable   Back / Musculoskeletal No Midline Back Pain or tenderness or step offs / Moving all extremities, 2+ peripheral Pulses, good distal senstaion   Skin W/D     Labs:   Last CBC w diff Results:  Recent Labs   Lab 11/06/22  1543 11/07/22  0602 11/08/22  0548   WBC 5.9 7.1 7.2   HEMOGLOBIN 13.1  12.5 12.9   HCT 38.4 36.4 37.7   PLT 184 172 160   RBC 4.11 3.91 4.06   MCV 93.4 93.2 93.0   MCHC 34.1 34.3 34.1   MCH 31.8 32.0 31.7   RDW 13.5 13.6 13.3   NEUTROPHILS 71.1 74.8  --    MONOCYTES 12.9 10.2  --     Last Renal Function Results:  Recent Labs   Lab 11/06/22  1543 11/07/22  0602 11/08/22  0548   NA 139 139 137   K 3.9 3.6 3.4*   CL 103 104 103   CO2 27 25 24    BUN 22 24 23    CREATININE 1.18 1.16 1.13   GLU 114* 107 96   CALCIUM 9.3 8.9 8.5*   ALBUMIN 4.3 3.8  --    MG  --   --  1.8       Last Liver Function/Coags/Cardiac Results:  Recent  Labs   Lab 11/06/22  1543 11/07/22  0602   ALT 13 13   AST 18 20   BILIDIR 0.08  --     Microbiology:  Results for orders placed or performed during the hospital encounter of 11/06/22   Blood Culture    Specimen: Arm-Left; Blood   Result Value Ref Range    Blood Culture No Growth at 18-24 hrs.      No results found for this or any previous visit.      Imaging Studies: CT-ANGIO HEAD&NECK W AND/OR WO CON W/POST IMG    Result Date: 11/07/2022  IMPRESSION: 1.  No aneurysms, vascular occlusions, or intracranial stenoses identified. 2.  No significant stenosis in the extracranial vertebral or internal carotid arteries. 50% stenosis of left external carotid artery origin. Electronically Signed by: Lora Paula, MD, 11/07/2022 5:10 PM     XR-WRIST LEFT 2 VIEWS    Result Date: 11/07/2022  IMPRESSION: No acute osseous abnormality. Mild to moderate degenerative joint disease.    Electronically Signed by: Janit Pagan, MD, 11/07/2022 2:04 PM     XR-FOREARM LEFT 2 VIEWS    Result Date: 11/07/2022  IMPRESSION: No acute osseous abnormality or significant degenerative joint disease.    Electronically Signed by: Janit Pagan, MD, 11/07/2022 2:03 PM     XR-WRIST RIGHT 2 VIEWS    Result Date: 11/07/2022  IMPRESSION: No acute osseous abnormality. Mild degenerative joint disease.    Electronically Signed by: Janit Pagan, MD, 11/07/2022 2:02 PM     XR-FOREARM RIGHT 2 VIEWS    Result Date:  11/07/2022  IMPRESSION: No acute osseous abnormality or significant degenerative joint disease.    Electronically Signed by: Janit Pagan, MD, 11/07/2022 2:02 PM     XR-HIP BIL 3-4 VIEWS W/ PELVIS    Result Date: 11/07/2022  IMPRESSION: No acute osseous abnormality. Mild hip joint space narrowing most consistent with degenerative osteoarthritis.    Electronically Signed by: Janit Pagan, MD, 11/07/2022 2:01 PM       Assessment:     Principal Problem:    COVID-19    Plan:     Afebrile vital signs stable.  Imaging was all negative for any acute fractures.  Tertiary exam complete.  Imaging reviewed.  Will sign off at this time    Electronically signed by:  Rolm Gala, DO  11/08/2022       Electronically signed by Rolm Gala, DO at 11/08/2022 10:27 AM EST

## 2022-11-08 NOTE — Care Plan (Signed)
Formatting of this note might be different from the original.  End of Shift and Plan of Care Summary        Patient was up in recliner this shift. Chair alarm in place for safety. PRN tylenol given for temp and was effective. IV magnesium given pr order and tolerated well. Hourly rounding in place to anticipate needs. No complaints voiced.    Goals per Patient Condition   Fall Prevention Plan: Patient will remain free from falls. See the Daily cares/safety flowsheet for intervention documentation.  Skin Integrity Plan: Patient skin integrity maintained. See Integumentary flowsheet for intervention documentation.  Isolation Precautions in Use: Isolation precautions in place. See Daily cares/safety flowsheet for intervention documentation.  DVT Prophylaxis in Use: Patient will remain free from DVT. See Daily cares/safety flowsheet for intervention documentation.  Pain Management Plan: Patient is being monitored for pain and response to treatment. See Vital Signs Complex Assessment flowsheet and MAR for intervention documentation.      Goals/Plan for shift  Patient/Family stated goal for shift: maintain comofrt  Nursing goal for shift: maintain safety, comfort, promote hydration, maintain temp below 100.4 as able  Plan: Monitor I&O. ENcourage PO fluids. Monitor temp. administer meds as ordered, fall precautions    Goals/Plan for Hospital Stay  Patient/Family stated goal for hospital stay: feel better  Nursing goal for hospital stay: control infection and pain, safety  Plan administer meds as ordered    Electronically signed by Westly Walter McCarnan, RN at 11/08/2022  9:58 PM EST

## 2022-11-08 NOTE — Care Plan (Signed)
Formatting of this note might be different from the original.  End of Shift and Plan of Care Summary        Patient was up in recliner this shift. Chair alarm in place for safety. PRN tylenol given for temp and was effective. IV magnesium given pr order and tolerated well. Hourly rounding in place to anticipate needs. No complaints voiced.    Goals per Patient Condition   Fall Prevention Plan: Patient will remain free from falls. See the Daily cares/safety flowsheet for intervention documentation.  Skin Integrity Plan: Patient skin integrity maintained. See Integumentary flowsheet for intervention documentation.  Isolation Precautions in Use: Isolation precautions in place. See Daily cares/safety flowsheet for intervention documentation.  DVT Prophylaxis in Use: Patient will remain free from DVT. See Daily cares/safety flowsheet for intervention documentation.  Pain Management Plan: Patient is being monitored for pain and response to treatment. See Vital Signs Complex Assessment flowsheet and MAR for intervention documentation.      Goals/Plan for shift  Patient/Family stated goal for shift: maintain comofrt  Nursing goal for shift: maintain safety, comfort, promote hydration, maintain temp below 100.4 as able  Plan: Monitor I&O. ENcourage PO fluids. Monitor temp. administer meds as ordered, fall precautions    Goals/Plan for Hospital Stay  Patient/Family stated goal for hospital stay: feel better  Nursing goal for hospital stay: control infection and pain, safety  Plan administer meds as ordered    Electronically signed by Bertrum Sol, RN at 11/08/2022  9:58 PM EST

## 2022-11-08 NOTE — Progress Notes (Signed)
Formatting of this note is different from the original.  Discharge Planning  Social Work   Date/Time of Admission: 11/06/2022  8:32 PM  Patient presents with:      Diagnosis    *COVID-19     Attending Provider: Sivani Siva Pathmarajah,*    PCP: SAMUEL C DOCENA, MD  Room/Bed: F5540/F5540-A  GLOS:    ALOS: 0 days  DOB: 05/04/1926  Age: 88 y.o.    Plan A: PT/OT pending  Plan B:      Risk Stratification:   48  0-28 = Low (Green)  29-58= Moderate (Yellow)  59-90= High (Red)    Barrier Assessment:  Medical Management Needs: Polypharmacy > 5 medications  Health Literacy: Hx of dementia/alzheimer's/stroke  Self Care: Need assistance with ADL's, Fall history  Mental Health: None apply  Family/Patient Resistance: None apply  Advanced Illness/Disease: None apply  Substance Abuse: None apply  Problem Medications: None apply  Increased Skilled Needs: None apply  Complex Placement: None apply    Assessment and Plan:  SW spoke with pt's niece Melissa Webb and daughter/DPOA Alberta McCane regarding the discharge planning process. Family is interested in pt going to a SNF for a rehab stay, due to her weakened physical state and not being at her baseline. They are working with [facility] regarding A.L for pt. Ideally, they would like pt to go to [facility] for rehab and then transition to A.L. Spoke with Carly, admissions at [facility] and faxed chart information. Carly states that pt's positive covid test will not provide an obstacle for pt being accepted. They will review the referral. Will need to determine if pt's insurance is in-network.  Provided patient/family/caregivers with SNF list generated by [employer].  List of providers covered by their insurance, including overall and quality star ratings and information regarding [facility].  Provided education about ratings and directed them to visit www.Medicare.gov or www.NursingHomeCompare.com for further information to compare facilities.  Texted list to  [place] in case additional SNF choices are needed. The collaboration with the interdisciplinary team will continue with patient and family throughout the hospitalization.  For future contact please call [phone].    Electronically signed by Ilyse E Wells, MSW, LISW-S at 11/08/2022  4:14 PM EST

## 2022-11-08 NOTE — Progress Notes (Signed)
Formatting of this note is different from the original.  Discharge Planning  Social Work   Date/Time of Admission: 11/06/2022  8:32 PM  Patient presents with:      Diagnosis    *COVID-19     Attending Provider: Hilbert Corrigan,*    PCP: Janetta Hora, MD  Room/Bed: Q0086/P6195-K  GLOS:    ALOS: 0 days  DOB: January 20, 1926  Age: 88 y.o.    Plan A: PT/OT pending  Plan B:      Risk Stratification:   48  0-28 = Low (Green)  29-58= Moderate (Yellow)  59-90= High (Red)    Barrier Assessment:  Medical Management Needs: Polypharmacy > 5 medications  Health Literacy: Hx of dementia/alzheimer's/stroke  Self Care: Need assistance with ADL's, Fall history  Mental Health: None apply  Family/Patient Resistance: None apply  Advanced Illness/Disease: None apply  Substance Abuse: None apply  Problem Medications: None apply  Increased Skilled Needs: None apply  Complex Placement: None apply    Assessment and Plan:  SW spoke with pt's niece Steele Berg and daughter/DPOA [place] regarding the discharge planning process. Family is interested in pt going to a SNF for a rehab stay, due to her weakened physical state and not being at her baseline. They are working with [employer] regarding A.L for pt. Ideally, they would like pt to go to [facility] for rehab and then transition to [place]. Spoke with Carly, admissions at [facility] and faxed chart information. Carly states that pt's positive covid test will not provide an obstacle for pt being accepted. They will review the referral. Will need to determine if pt's insurance is in-network.  Provided patient/family/caregivers with SNF list generated by [employer].  List of providers covered by their insurance, including overall and quality star ratings and information regarding [facility].  Provided education about ratings and directed them to visit www.Medicare.gov or www.NursingHomeCompare.com for further information to compare facilities.  Texted list to  [place] in case additional SNF choices are needed. The collaboration with the interdisciplinary team will continue with patient and family throughout the hospitalization.  For future contact please call [phone].    Electronically signed by Barnetta Chapel, MSW, LISW-S at 11/08/2022  4:14 PM EST

## 2022-11-08 NOTE — Care Plan (Signed)
Formatting of this note might be different from the original.  End of Shift and Plan of Care Summary  Alert and oriented x2-3 w/intermittent confusion. Patient able to communicate needs, actively participating in care. Family very involved and supportive in care delivery.     Modified transmission based precautions in place. VSS, Respirations even and unlabored. Low grade fever at HS 99.2, rechecked and noted at 99.0. PRN tylenol given and effective. Patient remained afebrile t/o shift. No SOB. Non productive cough present occasionally.     No S/S of acute distress noted.     Patient self removed Right AC peripheral IV at approximately 0500. Attempts to access IV were unwelcome by patient, AEB behaviors and verbalizations. Patient known w/improved compliance w/care, specific to injections, IV therapy and medications while family is present at bedside. Patient would benefit from family present with next attempts to re-access IV. No current ordered infusions.     No c/o pain t/o shift.     Patient incontinent of bladder. External catheter placed, patient allowed.     Family visits were ongoing at bedside t/o shift.     Fall Prevention: safety maintained, fall risk protocol in place. Bed in lowest position with wheels locked. Yellow socks and bed alarm on for safety.  Side rails up x2. Call light and bedside table within reach.     Discharge: Patient from home with expectation to return home upon discharge.     Plan of care ongoing.     Goals per Patient Condition   Fall Prevention Plan: Patient will remain free from falls. See the Daily cares/safety flowsheet for intervention documentation.  Skin Integrity Plan: Patient skin integrity maintained. See Integumentary flowsheet for intervention documentation.  Isolation Precautions in Use: Isolation precautions in place. See Daily cares/safety flowsheet for intervention documentation.  DVT Prophylaxis in Use: Patient will remain free from DVT. See Daily cares/safety  flowsheet for intervention documentation.  Pain Management Plan: Patient is being monitored for pain and response to treatment. See Vital Signs Complex Assessment flowsheet and MAR for intervention documentation.      Goals/Plan for shift  Patient/Family stated goal for shift: to feel better  Nursing goal for shift: infection control, pain management  Plan: Administer medications per MD orders and hourly rounding    Goals/Plan for Hospital Stay  Patient/Family stated goal for hospital stay: to feel better  Nursing goal for hospital stay: infection control and pain management  Plan Administer medications per MD orders and hourly rounding    Electronically signed by Andrea M Wamprecht, RN at 11/08/2022  6:49 AM EST

## 2022-11-08 NOTE — Care Plan (Signed)
Formatting of this note might be different from the original.  End of Shift and Plan of Care Summary  Alert and oriented x2-3 w/intermittent confusion. Patient able to communicate needs, actively participating in care. Family very involved and supportive in care delivery.     Modified transmission based precautions in place. VSS, Respirations even and unlabored. Low grade fever at HS 99.2, rechecked and noted at 99.0. PRN tylenol given and effective. Patient remained afebrile t/o shift. No SOB. Non productive cough present occasionally.     No S/S of acute distress noted.     Patient self removed Right AC peripheral IV at approximately 0500. Attempts to access IV were unwelcome by patient, AEB behaviors and verbalizations. Patient known w/improved compliance w/care, specific to injections, IV therapy and medications while family is present at bedside. Patient would benefit from family present with next attempts to re-access IV. No current ordered infusions.     No c/o pain t/o shift.     Patient incontinent of bladder. External catheter placed, patient allowed.     Family visits were ongoing at bedside t/o shift.     Fall Prevention: safety maintained, fall risk protocol in place. Bed in lowest position with wheels locked. Yellow socks and bed alarm on for safety.  Side rails up x2. Call light and bedside table within reach.     Discharge: Patient from home with expectation to return home upon discharge.     Plan of care ongoing.     Goals per Patient Condition   Fall Prevention Plan: Patient will remain free from falls. See the Daily cares/safety flowsheet for intervention documentation.  Skin Integrity Plan: Patient skin integrity maintained. See Integumentary flowsheet for intervention documentation.  Isolation Precautions in Use: Isolation precautions in place. See Daily cares/safety flowsheet for intervention documentation.  DVT Prophylaxis in Use: Patient will remain free from DVT. See Daily cares/safety  flowsheet for intervention documentation.  Pain Management Plan: Patient is being monitored for pain and response to treatment. See Vital Signs Complex Assessment flowsheet and MAR for intervention documentation.      Goals/Plan for shift  Patient/Family stated goal for shift: to feel better  Nursing goal for shift: infection control, pain management  Plan: Administer medications per MD orders and hourly rounding    Goals/Plan for Hospital Stay  Patient/Family stated goal for hospital stay: to feel better  Nursing goal for hospital stay: infection control and pain management  Plan Administer medications per MD orders and hourly rounding    Electronically signed by Sydnee Cabal, RN at 11/08/2022  6:49 AM EST

## 2022-11-08 NOTE — Progress Notes (Signed)
Formatting of this note is different from the original.  Hospitalist Progress Note    Patient Name:  Janice Cunningham     Date:  11/08/2022    Admitted complain:  SOB    Active Problem/s:  COVID infection    Subjective:  Pt was seen in the room  Generalized weakness  She is on RA  Pt is in the COVID isolation unit. To minimize the exposure,pt was examined with some limitations.  Objective:  Physical Exam:    BP (!) 198/86   Pulse 86   Temp 98.1 F (36.7 C)   Resp 18   Ht 5\' 3"  (1.6 m)   Wt 112 lb 7 oz (51 kg)   SpO2 92%   BMI 19.92 kg/m   BP (!) 198/86   Pulse 86   Temp 98.1 F (36.7 C)   Resp 18   Ht 5\' 3"  (1.6 m)   Wt 112 lb 7 oz (51 kg)   SpO2 92%   BMI 19.92 kg/m   General appearance: alert.  Lungs: clear to auscultation bilaterally  Heart: regular rate and rhythm, S1, S2 normal, no murmur, click, rub or gallop  Abdomen: soft, non-tender; bowel sounds normal; no masses,  no organomegaly  Extremities: extremities normal, atraumatic, no cyanosis or edema  Neurologic: Grossly normal    Labs:   CBC:   Recent Labs     11/06/22  1543 11/07/22  0602 11/08/22  0548   WBC 5.9 7.1 7.2   HEMOGLOBIN 13.1 12.5 12.9   HCT 38.4 36.4 37.7   MCV 93.4 93.2 93.0   PLT 184 172 160     BMP:   Recent Labs     11/06/22  1543 11/07/22  0602 11/08/22  0548   NA 139 139 137   K 3.9 3.6 3.4*   CL 103 104 103   CO2 27 25 24    BUN 22 24 23    CREATININE 1.18 1.16 1.13     LIVER PROFILE:   Recent Labs     11/06/22  1543 11/07/22  0602   AST 18 20   ALT 13 13   BILIDIR 0.08  --      PT/INR:  No results found for: "PROTIME", "INR"  PTT:  No results found for: "APTT"  UA: No results for input(s): "NITRITE", "COLORU", "PHUR", "MUCUS", "TRICHOMONAS", "YEAST", "BACTERIA", "SPECGRAV", "UROBILINOGEN", "GLUCOSEU", "KETONESU", "AMORPHOUS" in the last 72 hours.    Invalid input(s): "LABCAST", "WBCUA", "RBCUA", "CLARITYU", "LEUKOCYTESUR", "BILIRUBINUR", "BLOODU"    Imaging:  CT-ANGIO HEAD&NECK W AND/OR WO CON W/POST IMG    Result Date:  11/07/2022  IMPRESSION: 1.  No aneurysms, vascular occlusions, or intracranial stenoses identified. 2.  No significant stenosis in the extracranial vertebral or internal carotid arteries. 50% stenosis of left external carotid artery origin. Electronically Signed by: Lora Paula, MD, 11/07/2022 5:10 PM     XR-WRIST LEFT 2 VIEWS    Result Date: 11/07/2022  IMPRESSION: No acute osseous abnormality. Mild to moderate degenerative joint disease.    Electronically Signed by: Janit Pagan, MD, 11/07/2022 2:04 PM     XR-FOREARM LEFT 2 VIEWS    Result Date: 11/07/2022  IMPRESSION: No acute osseous abnormality or significant degenerative joint disease.    Electronically Signed by: Janit Pagan, MD, 11/07/2022 2:03 PM     XR-WRIST RIGHT 2 VIEWS    Result Date: 11/07/2022  IMPRESSION: No acute osseous abnormality. Mild degenerative joint disease.    Electronically Signed by: Eulas Post  Ladona Ridgel, MD, 11/07/2022 2:02 PM     XR-FOREARM RIGHT 2 VIEWS    Result Date: 11/07/2022  IMPRESSION: No acute osseous abnormality or significant degenerative joint disease.    Electronically Signed by: Vallarie Mare, MD, 11/07/2022 2:02 PM     XR-HIP BIL 3-4 VIEWS W/ PELVIS    Result Date: 11/07/2022  IMPRESSION: No acute osseous abnormality. Mild hip joint space narrowing most consistent with degenerative osteoarthritis.    Electronically Signed by: Vallarie Mare, MD, 11/07/2022 2:01 PM     XR-CHEST PORTABLE STAT    Result Date: 11/06/2022  IMPRESSION: No acute pulmonary disease. Electronically Signed by: Gerald Leitz, MD, 11/06/2022 3:56 PM      Assessment:  Principal Problem:    COVID-19      Plan:    COVID Infection.  Tested positive : 11/06/22  Admitted  to COVID isolation unit  Placed on Vitamin C, Vitamin D, & Zinc   Pulmonary  consulted  Hypertensive urgency  Continue  norvasc.  Acute Metabolic Encephalopathy  Agitated, treated with haldol  Dementia  HTN  Continue  norvasc.  Fall at home  Diarrhea  Hypokalemia.  K was  replaced..  Hypomagnesemia.  Magnesium was replaced.  Advanced age  DVT prophylaxis.  Patient was placed on lovenox subcutaneous bid.  DC Planning.  PT/OT/SS to see for the d/c planning.    Hydrate, no need for remdezvir per pulmonary  High risk for clinical decline    Electronic Signature:  Wynona Luna, MD 11/08/2022 9:08 AM     Due to the current situation with the COVID - 19 pandemic, to minimize the exposure, the chart in Epic was reviewed. Based on the chart review, discussion with nursing staff & pulmonary service medical decision was made.        Electronically signed by Wynona Luna, MD at 11/08/2022 12:36 PM EST

## 2022-11-08 NOTE — Progress Notes (Signed)
Formatting of this note is different from the original.   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  has a past surgical history that includes Hysterectomy.       OT Discharge Recommendations  1. Occupational Therapy services recommended to continue after discharge: Yes  2. Supervision for safety required: Full time supervision required: Unobserved for brief periods of time only but not left alone  3. Physical assistance required for daily care: Maximum Physical Assistance required: Caregiver supplies 51-75% of physical assistance   4. OT Discharge Equipment Needs If Returning Home: Other (Comment) (Continue to assess)       Functional Outcomes/Testing and Reporting    Daily Activity AM-PAC 6 Click: 10    General  - Subjective: Pt states, "I can't hear you".    - Current Medical Status: Janice Cunningham is a 88 y.o. female who presents to the Emergency Department for evaluation of lethargy and increased confusion.  Family reports that the patient ate very little yesterday and was not able to get up and move around and slept most of the day.  Pt positive for COVID. Pt was DC'd home and experienced fall. Returns to ED and admitted.    - Testing/Imaging: CT-ANGIO HEAD&NECK   IMPRESSION: 1.  No aneurysms, vascular occlusions, or intracranial stenoses identified. 2.  No significant stenosis in the extracranial vertebral or internal carotid arteries. 50% stenosis of left external carotid artery origin. XR-WRIST LEFT 2 VIEWS   IMPRESSION: No acute osseous abnormality. Mild to moderate degenerative joint disease.  XR-FOREARM LEFT 2 VIEWS IMPRESSION: No acute osseous abnormality or significant degenerative joint disease.  XR-WRIST RIGHT 2 VIEWS  IMPRESSION: No acute osseous abnormality. Mild degenerative joint disease. XR-FOREARM RIGHT 2 VIEWS IMPRESSION: No acute osseous abnormality or significant degenerative joint disease.     XR-HIP BIL 3-4 VIEWS W/ PELVIS   IMPRESSION: No acute osseous abnormality. Mild hip joint space narrowing most consistent with degenerative osteoarthritis.    - Past therapy for current diagnosis, illness or injury? No      Weightbearing Status  Acute Weight Bearing Status: Full Weight Bearing (No restrictions ordered)    Therapy Precautions  Fall Risk  Additional Comments:     Orthotics/Braces      Home Environment  Source: Patient is unreliable historian (Pt with dementia at baseline and HOH, unable to get PLOF)                  Home Equipment       Prior Functional Level                Premorbid Communication: Wears hearing aids, Hearing impaired, Wears glasses/contacts  Premorbid Cognition: Impaired    Session Beginning Location, Safety Info  Lines, Drains, Monitors, Equipment: Telemetry    Upon Arrival Patient Location: In bed (Pt attempting to get out of bed upon therapy arrival, noting B LE over bed rail.)   Upon Arrival Safety: Bed alarm on, Vitals stable     Pain Assessment  Pain Score: Patient unable to  rate but no noted signs of pain  Pain Location: N/A  Pain Intervention: Patient reports pain tolerable level for therapy    Extremity Assessment - Unable to formally assess ROM and MMT d/t poor command following and pt being HOH, based on skilled observation, ROM WFL, MMT 3/5     RUE AROM PROM Strength   Shoulder     3   Elbow     3   Wrist         Pronate         Supinate         Hand     3         LUE AROM PROM Strength   Shoulder     3   Elbow     3   Wrist         Pronate         Supinate         Hand     3           Task Current Assist Level Devices, Other Details   Grooming Standby  supervision    UB Dressing Total assist      LB Dressing Total assist      Bathing Total assist      Toileting Total assist      Bed Mobility Other (comment) (MOD A x2)    Sit to/from Stand Maximal assist    FWW    Toilet Transfer              Activities of Daily Living and Functional Mobility    Unless otherwise specified, above values represent clinical judgment based on simulated activity participation during session.     Feeding Assistance  Feeding Assistance Level: Set up supervision  Assistance Required for: Supervision/safety        Grooming Assistance  Grooming Assistance Level: Standby supervision  Assistance Required for: Supervision/safety        Upper Body Dressing  UB Dressing Assistance: Total assist  Assistance Required for: Pull over head, Thread RUE, Verbal cueing  UB Dressing Tasks Completed: Pt partially pulled off gown prior to therapy entering room, requiring therapy to cue and physically assisting with donning gown back on  Position:  (Supine)    Lower Body Dressing  LB Dressing Assistance Level: Total assist          Bathing Assistance  Bathing Assistance Level: Total assist          Toileting Assistance  Toileting  Assistance Level: Total assist  Assistance Required for: Full brief change due to incontinence, Supervision/safety  Toileting Tasks Completed: External urinary catheter placed prior to therapy arrival, noting soiled brief. External urinary catheter remove and soiled brief doffed in stance, placing clean brief on chair and guiding patient's hips on recliner.    Bed Mobility  Bed Mobility Assistance Level: Other (comment) (MOD A x2)  Assistance Required for: HOB elevated, Increased time to complete, Supervision/safety, Verbal cues for technique, Initiation, Sequencing, Moving LLE to edge of bed, Moving RLE to edge of bed, Trunk  Bed Mobility Tasks Completed: Therapy initiating B LE movement 2/2 HOH, with pt requring assistance for trunk and B LE. Once seated EOB, pt with slight  posterior lean, requiring cues for upright sitting balance.    Sit to from Stand  Sit to Stand  Assistance Level: Maximal assist  Assistance Required for: Hand placement, Proper technique, Supervision/safety  Assistive Devices: FWW    Functional Mobility  Functional Mobility Assistance: Total assist (MIN A x2 progressing to MOD A x2)  Assistance Required for: Verbal cueing, Supervision/safety, Increased time to complete/slow pace, Sequencing, Problem solving  Assistive Devices: FWW  Functional Tasks Completed: Once standing, pt cued to ambulate to recliner. Pt requiring increased time for motor planning, with pt noting to ambulate ~15 seconds after cue. Small steps noted, with pt stopping during ambulation and further knee flexion noted. Recliner brought closer to patient d/t safety awareness and increased fatigue.    Oxygen Administration Details  Oxygen (L/m): 0  Oxygen Delivery Device: RA    Pertinent Medication/Information  No s/s of distress. As therapy was donning proper PPE, pt noted to attempt getting out of bed, with therapy entering room to assist. Therapy assisted with donning hearing aids at start of the session. RN present in room shortly after therapy arrival to take BP supine, 150/77.  Patient reporting dizziness when seated EOB, BP 158/70. Wet cough noted throughout evaluation.     Cognition  Mental State: Alert  Social Interaction Skills: Calm, Cooperative, Delayed  Follows Commands: 1 step, Inconsistently, Delayed  Orientation: Person  Additional Cognitive Concerns Noted: Insight, Motor Planning, Memory, Problem Solving, Safety Awareness/Judgement, Sequencing, Initiation, Attention Span      Visual Processing  Current Vision: Wears glasses all the time                  Sensation  Gross Sensation: Not tested    Motor Coordination  Fine Motor: Functional  Gross Motor: Functional    Session Ending Location, Safety Info  End of Session Patient Location: Sitting in chair  End of Session Patient Safety:  Chair alarm on, Gait belt used, Call light within reach, Phone within reach, External urinary catheter removed (Nursing staff aware)   Interdisciplinary Communication: Spoke with RN prior to session, RN updated on patient performance, Spoke with PT    Patient/Caregiver Education  Education Provided: Role of OT, Education about rehab diagnosis, Use of call light for assistance  Patient Education Completed: Requires continued education  Family/Caregiver Education Completed: No family/caregiver present    Assessment  Problem List/Deficits: Grooming, Dressing, Toileting, Bathing, Functional mobility, Balance, Safety/judgement, Feeding  Rehab Diagnosis: Assistance with personal care  Rehab Potential to Meet Established Goals: Fair    Plan  OT Patient/Family Goal: To go back home  Treatment Interventions to Address Currrent Goals: Self care/Home Management, Therapeutic activities, Therapeutic exercise   OT Frequency: 5x/wk    OT Duration of Treatment: 2 weeks  OT Co-Treat: Yes   Reason for Cotreatment: Requires > mod assist for progressive mobility out of bed activities    Goals  OT POC Due: 11/22/22  Feeding  Will complete feeding with: Independence  Feeding Goal Status: Progressing, ongoing    Grooming  Will complete grooming/simple hygiene with: Distant supervision (Seated)  Grooming Goal Status: Progressing, ongoing    Upper Body Dressing  Will complete upper body (UB) dressing with: Minimal assist  UB Dressing Goal Status: Progressing, ongoing    Lower Body Dressing  Will complete lower body (LB) dressing with: Maximal assist  LB Dressing Goal Status: Progressing, ongoing    Bathing  Will complete bathing with: Maximal assist  Bathing Goal Status: Progressing, ongoing    Toileting  Will complete toileting with: Maximal assist  Toileting Goal Status: Progressing, ongoing    Bed Mobility  Will complete bed mobility with: Moderate assist  Bed Mobility Goal Status: Progressing, ongoing    Sit to/fom Stand  Will perform  sit to/from stand with: Moderate assist, Least restrictive assistive device  Sit to/from Stand Goal Status: Progressing, ongoing    Functional Mobility  Patient will perform functional mobility task with: Moderate assist, Least restrictive assistive device  Functional Mobility Goal Status: Progressing, ongoing   Toilet Transfer  Patient will perform toilet transfer: Moderate assist, Least restrictive assistive device  Toilet Transfer Goal Status: Progressing, ongoing    Home Exercise  To complete HEP : With minimal verbal cues  HEP Goal Status: Progressing, ongoing      Evaluation/Minutes  Date Initial Eval Completed: 11/08/22  Time Initial Eval Initiated: 0954  Time Eval Completed: 1025  Total Evaluation Time: 31        Electronically signed by Margie Ann Ferguson, MOT, OTR/L at 11/08/2022 12:32 PM EST

## 2022-11-08 NOTE — Progress Notes (Signed)
Formatting of this note is different from the original.   Physical Therapy Evaluation    Admit date:  11/06/2022          Today's Date: 11/08/2022      Patient Name/MR#:  Janice Cunningham     [id number]   Current Room:  G2563/S9373-S   Admitting Diagnosis: Weakness generalized [R53.1]  COVID-19 [U07.1]    Admitting Provider: Clarisse Gouge, MD   Past Medical/Surgical History:   has a past medical history of Dementia (HCC) and ESBL (extended spectrum beta-lactamase) producing bacteria infection (02/26/2021).  has a past surgical history that includes Hysterectomy.      PT Discharge Recommendations  1. Physical Therapy services recommended to continue after discharge: Yes  2. Supervision for safety required: Caregiver will need to provide full time supervision: Unobserved for brief periods of time only but not left alone  3. Physical Assistance required for daily care: Caregiver will need to provide maximum physical assistance: Caregiver will need to supply 51-75% of physical assistance  4. PT Discharge Equipment Needs if Returning Home: Other (Comment)      PT Discharge Equipment Comments: continue to assess    Functional Outcomes/Testing and Reporting    Basic Mobility: AM-PAC 6 Clicks:10    General  - Subjective: "what?"    - Current Medical Status: Janice Cunningham is a 88 y.o. female who presents to the Emergency Department for evaluation of lethargy and increased confusion.  Family reports that the patient ate very little yesterday and was not able to get up and move around and slept most of the day.  Pt positive for COVID. Pt was DC'd home and experienced fall. Returns to ED and admitted.    - Testing/Imaging: CT-ANGIO HEAD&NECK   IMPRESSION: 1.  No aneurysms, vascular occlusions, or intracranial stenoses identified. 2.  No significant stenosis in the extracranial vertebral or internal carotid arteries. 50% stenosis of left external carotid artery origin. XR-WRIST LEFT 2 VIEWS   IMPRESSION: No acute osseous  abnormality. Mild to moderate degenerative joint disease. XR-FOREARM LEFT 2 VIEWS IMPRESSION: No acute osseous abnormality or significant degenerative joint disease.  XR-WRIST RIGHT 2 VIEWS  IMPRESSION: No acute osseous abnormality. Mild degenerative joint disease. XR-FOREARM RIGHT 2 VIEWS IMPRESSION: No acute osseous abnormality or significant degenerative joint disease.     XR-HIP BIL 3-4 VIEWS W/ PELVIS   IMPRESSION: No acute osseous abnormality. Mild hip joint space narrowing most consistent with degenerative osteoarthritis.    - Past therapy for current diagnosis, illness or injury?: Unknown      Weightbearing Status   (n/a)    Therapy Precautions  Fall Risk  Additional Comments:     Orthotics/Braces  Not applicable    Home Environment  Source: Patient is unreliable historian (Pt with dementia at baseline and HOH, unable to get PLOF)    Home Equipment       Prior Functional Level  Functional Transfers: Other (comment) (unreliable historian. unable to provide subjective information.)  Functional Mobility: Other (comment) (unreliable historian. unable to provide subjective information.)    Premorbid Communication: Hearing impaired, Wears hearing aids  Premorbid Cognition: Impaired (per chart, pt with dementia and slight confusion at baseline.)    Session Beginning Location, Safety Info  Lines, Drains, Monitors, Equipment: External urinary catheter  Upon Arrival Patient Location: In bed  Upon Arrival Safety: Bed alarm on    Pain Assessment  Pain Score: Patient unable to rate but no noted signs of pain  Extremity Assessment  RLE AROM PROM Strength   Hip      Flexion         Extension         Abduction         Adduction         Knee      Flexion         Extension         Ankle      Dorsiflexion         Plantar Flexion         Comments: unable to formally assess AROM and strength. During functional mobility, pt appears to have full AROM and grossly 3+/5 strength of R LE.       LLE AROM PROM Strength   Hip       Flexion           Extension           Abduction            Adduction           Knee      Flexion           Extension           Ankle      Dorsiflexion           Plantar Flexion           Comments: unable to formally assess AROM and strength. During functional mobility, pt appears to have full AROM and grossly 3+/5 strength of L LE.         Task Current Assist Level Devices, Other Details   Bed Mobility      Rolling Right Left      Supine to Sit Total assist (Mod A x2)    Sit to Supine      Sit to/from Stand Maximal assist (stands from EOB once and from chair once. requires assist for controlled descent.) On Evaluation  FWW  Subsequent Treatment    Transfer       Bed to Chair Other (comment) (Min A x2 progressing to Mod A x2 as pt fatigues. pt begins ambulating towards chair. stops half way to chair. chair brought closer to pt and therapists guid hips into chair.) FWW   Gait Training Moderate assist (Min A x2)    On Evaluation  FWW    Subsequent Treatment    Distance:  On Evaluation: 4 feet   Subsequent Treatment:     Stair Training      On Evaluation:    Subsequent Treatment:       Mobility  Supine to Sit  Supine to Sit Assistance: Total assist (Mod A x2)  Assistance Required for: Bilateral LEs, Trunk, Physical assist for scooting hips to edge of bed  Therapeutic Modifications During Session: HOB elevated, Use of L rail    Sitting Balance  Sitting Balance Assistance: Minimal assist  Balance Support/Assist Required for: Posterior lean, Feet supported      Sit to Stand  Sit to Stand Assistance: Maximal assist (stands from EOB once and from chair once. requires assist for controlled descent.)  Devices: FWW  Assistance Required for: Clearing buttocks from surface, Hand placement, Proper technique  Therapeutic Modifications Required for: Bed height elevated    Standing Balance  Standing Balance Assistance: Moderate assist (Min A x2 at [facility])  Assistance Required for:  (balance)    Bed to Chair  Bed to Chair Assistance:  Other (comment) (  Min A x2 progressing to Mod A x2 as pt fatigues. pt begins ambulating towards chair. stops half way to chair. chair brought closer to pt and therapists guid hips into chair.)  Devices: FWW  Assistance Required for: Safety, Management of AD, Controlled descent      Gait  Gait Assistance: Moderate assist (Min A x2)  Devices: FWW  Gait Pattern: Shuffling, Narrow BOS, Decreased step length Left, Decreased step length Right, Decreased gait speed (fwd flexed posture)  Assistance Required for: Tactile cues to aid in correction of stated deviations  Total Distance Ambulated: 4 feet    Observations: no s/s of distress.pt reports feeling dizzy at EOB. BP 155/80 mmHG pt very HOH. hearing aides placed by OT which did not improve pt's hearing much. due to this, very difficult to get pt to follow directions.    Activity Tolerance  Functional Activity Tolerance: Tolerate 10-20 min activity with multiple rest breaks        Cognition  Mental State: Alert  Social Interaction Skills: Calm, Cooperative  Follows Commands: 1 step, Inconsistently, Delayed  Orientation: Person  Additional Cognitive Concerns Noted: Insight, Memory, Problem Solving, Safety Awareness/Judgement      Sensation  Touch: Other(comment) (unable to accuratetly assess d/t poor command following)        Gross Motor  Heel/Shin:  (unable to accuratetly assess d/t poor command following)            Session Ending Location, Safety Info  End of Session Patient Location: Sitting in chair  End of Session Patient Safety: Chair alarm on, Gait belt used, Call light within reach, Phone within reach, External urinary catheter removed (Nursing staff aware)  Interdisciplinary Communication: Spoke with RN prior to session, RN updated on patient performance    Patient/Caregiver Education  Education Provided: Role of PT, Use of call light for assistance, Treatment plan reviewed, Importance of mobility  Patient Education Completed: Requires continued  education  Family/Caregiver Education Completed: No family/caregiver present    Assessment  Problem List/Deficits: Balance, Functional activity tolerance, Gait, Strength  Rehab Diagnosis: Decreased functional mobility  Rehab Potential to Meet Established Goals: Fair    Plan  PT Patient/Family Goal: unable to state goal at time of evaluation  Treatment: Therapeutic activities, Therapeutic exercise, Gait training, Neuromuscular re-education, Self-care  PT Frequency: 5x/wk  PT Duration of Treatment: 2 weeks  PT Co-Treat: Yes  Reason for Cotreatment: Poor activity tolerance for participation in multiple treatment sessions, Requires > mod assist for progressive mobility out of bed activities    Goals  Supine to Sit  Will perform supine to sit with: Minimal assist  Status: Progressing, ongoing    Sit to Supine  Will perform sit to supine with: Minimal assist  Status: Progressing, ongoing    Sit to/From Stand  Will perform sit to/from stand with: Minimal assist, Least restrictive device  Status: Progressing, ongoing  Bed to Chair  Will perform sit to/from chair with: Minimal assist, Least restrictive assistive device  Status: Progressing, ongoing  Gait Training  Will ambulate: 100-150 feet, Minimal assist, Least restrictive assistive device  Status: Progressing, ongoing    General Balance  Will participate in: Static, Dynamic, For 5 minutes, with minimal assist (LRAD)  Status: Progressing, ongoing  Home Exercise  Patient to complete HEP: Seated, Standing, With least restrictive assistive device, With minimal verbal cues  Status: Progressing, ongoing    POC DUE  11/22/22    Evaluation/Minutes  Time Initial Eval Initiated: 0956  Time Eval Completed: 1025  Total Evaluation Time: 29  Timed Code Minutes: 0 Minutes      Electronically signed by Marlis Edelson, PT at 11/08/2022 12:27 PM EST

## 2022-11-08 NOTE — Progress Notes (Signed)
Formatting of this note is different from the original.   Physical Therapy Evaluation    Admit date:  11/06/2022          Today's Date: 11/08/2022      Patient Name/MR#:  Janice Cunningham     [id number]   Current Room:  G2563/S9373-S   Admitting Diagnosis: Weakness generalized [R53.1]  COVID-19 [U07.1]    Admitting Provider: Clarisse Gouge, MD   Past Medical/Surgical History:   has a past medical history of Dementia (HCC) and ESBL (extended spectrum beta-lactamase) producing bacteria infection (02/26/2021).  has a past surgical history that includes Hysterectomy.      PT Discharge Recommendations  1. Physical Therapy services recommended to continue after discharge: Yes  2. Supervision for safety required: Caregiver will need to provide full time supervision: Unobserved for brief periods of time only but not left alone  3. Physical Assistance required for daily care: Caregiver will need to provide maximum physical assistance: Caregiver will need to supply 51-75% of physical assistance  4. PT Discharge Equipment Needs if Returning Home: Other (Comment)      PT Discharge Equipment Comments: continue to assess    Functional Outcomes/Testing and Reporting    Basic Mobility: AM-PAC 6 Clicks:10    General  - Subjective: "what?"    - Current Medical Status: Janice Cunningham is a 88 y.o. female who presents to the Emergency Department for evaluation of lethargy and increased confusion.  Family reports that the patient ate very little yesterday and was not able to get up and move around and slept most of the day.  Pt positive for COVID. Pt was DC'd home and experienced fall. Returns to ED and admitted.    - Testing/Imaging: CT-ANGIO HEAD&NECK   IMPRESSION: 1.  No aneurysms, vascular occlusions, or intracranial stenoses identified. 2.  No significant stenosis in the extracranial vertebral or internal carotid arteries. 50% stenosis of left external carotid artery origin. XR-WRIST LEFT 2 VIEWS   IMPRESSION: No acute osseous  abnormality. Mild to moderate degenerative joint disease. XR-FOREARM LEFT 2 VIEWS IMPRESSION: No acute osseous abnormality or significant degenerative joint disease.  XR-WRIST RIGHT 2 VIEWS  IMPRESSION: No acute osseous abnormality. Mild degenerative joint disease. XR-FOREARM RIGHT 2 VIEWS IMPRESSION: No acute osseous abnormality or significant degenerative joint disease.     XR-HIP BIL 3-4 VIEWS W/ PELVIS   IMPRESSION: No acute osseous abnormality. Mild hip joint space narrowing most consistent with degenerative osteoarthritis.    - Past therapy for current diagnosis, illness or injury?: Unknown      Weightbearing Status   (n/a)    Therapy Precautions  Fall Risk  Additional Comments:     Orthotics/Braces  Not applicable    Home Environment  Source: Patient is unreliable historian (Pt with dementia at baseline and HOH, unable to get PLOF)    Home Equipment       Prior Functional Level  Functional Transfers: Other (comment) (unreliable historian. unable to provide subjective information.)  Functional Mobility: Other (comment) (unreliable historian. unable to provide subjective information.)    Premorbid Communication: Hearing impaired, Wears hearing aids  Premorbid Cognition: Impaired (per chart, pt with dementia and slight confusion at baseline.)    Session Beginning Location, Safety Info  Lines, Drains, Monitors, Equipment: External urinary catheter  Upon Arrival Patient Location: In bed  Upon Arrival Safety: Bed alarm on    Pain Assessment  Pain Score: Patient unable to rate but no noted signs of pain  Extremity Assessment  RLE AROM PROM Strength   Hip      Flexion         Extension         Abduction         Adduction         Knee      Flexion         Extension         Ankle      Dorsiflexion         Plantar Flexion         Comments: unable to formally assess AROM and strength. During functional mobility, pt appears to have full AROM and grossly 3+/5 strength of R LE.       LLE AROM PROM Strength   Hip       Flexion           Extension           Abduction            Adduction           Knee      Flexion           Extension           Ankle      Dorsiflexion           Plantar Flexion           Comments: unable to formally assess AROM and strength. During functional mobility, pt appears to have full AROM and grossly 3+/5 strength of L LE.         Task Current Assist Level Devices, Other Details   Bed Mobility      Rolling Right Left      Supine to Sit Total assist (Mod A x2)    Sit to Supine      Sit to/from Stand Maximal assist (stands from EOB once and from chair once. requires assist for controlled descent.) On Evaluation  FWW  Subsequent Treatment    Transfer       Bed to Chair Other (comment) (Min A x2 progressing to Mod A x2 as pt fatigues. pt begins ambulating towards chair. stops half way to chair. chair brought closer to pt and therapists guid hips into chair.) FWW   Gait Training Moderate assist (Min A x2)    On Evaluation  FWW    Subsequent Treatment    Distance:  On Evaluation: 4 feet   Subsequent Treatment:     Stair Training      On Evaluation:    Subsequent Treatment:       Mobility  Supine to Sit  Supine to Sit Assistance: Total assist (Mod A x2)  Assistance Required for: Bilateral LEs, Trunk, Physical assist for scooting hips to edge of bed  Therapeutic Modifications During Session: HOB elevated, Use of L rail    Sitting Balance  Sitting Balance Assistance: Minimal assist  Balance Support/Assist Required for: Posterior lean, Feet supported      Sit to Stand  Sit to Stand Assistance: Maximal assist (stands from EOB once and from chair once. requires assist for controlled descent.)  Devices: FWW  Assistance Required for: Clearing buttocks from surface, Hand placement, Proper technique  Therapeutic Modifications Required for: Bed height elevated    Standing Balance  Standing Balance Assistance: Moderate assist (Min A x2 at [facility])  Assistance Required for:  (balance)    Bed to Chair  Bed to Chair Assistance:  Other (comment) (  Min A x2 progressing to Mod A x2 as pt fatigues. pt begins ambulating towards chair. stops half way to chair. chair brought closer to pt and therapists guid hips into chair.)  Devices: FWW  Assistance Required for: Safety, Management of AD, Controlled descent      Gait  Gait Assistance: Moderate assist (Min A x2)  Devices: FWW  Gait Pattern: Shuffling, Narrow BOS, Decreased step length Left, Decreased step length Right, Decreased gait speed (fwd flexed posture)  Assistance Required for: Tactile cues to aid in correction of stated deviations  Total Distance Ambulated: 4 feet    Observations: no s/s of distress.pt reports feeling dizzy at EOB. BP 155/80 mmHG pt very HOH. hearing aides placed by OT which did not improve pt's hearing much. due to this, very difficult to get pt to follow directions.    Activity Tolerance  Functional Activity Tolerance: Tolerate 10-20 min activity with multiple rest breaks        Cognition  Mental State: Alert  Social Interaction Skills: Calm, Cooperative  Follows Commands: 1 step, Inconsistently, Delayed  Orientation: Person  Additional Cognitive Concerns Noted: Insight, Memory, Problem Solving, Safety Awareness/Judgement      Sensation  Touch: Other(comment) (unable to accuratetly assess d/t poor command following)        Gross Motor  Heel/Shin:  (unable to accuratetly assess d/t poor command following)            Session Ending Location, Safety Info  End of Session Patient Location: Sitting in chair  End of Session Patient Safety: Chair alarm on, Gait belt used, Call light within reach, Phone within reach, External urinary catheter removed (Nursing staff aware)  Interdisciplinary Communication: Spoke with RN prior to session, RN updated on patient performance    Patient/Caregiver Education  Education Provided: Role of PT, Use of call light for assistance, Treatment plan reviewed, Importance of mobility  Patient Education Completed: Requires continued  education  Family/Caregiver Education Completed: No family/caregiver present    Assessment  Problem List/Deficits: Balance, Functional activity tolerance, Gait, Strength  Rehab Diagnosis: Decreased functional mobility  Rehab Potential to Meet Established Goals: Fair    Plan  PT Patient/Family Goal: unable to state goal at time of evaluation  Treatment: Therapeutic activities, Therapeutic exercise, Gait training, Neuromuscular re-education, Self-care  PT Frequency: 5x/wk  PT Duration of Treatment: 2 weeks  PT Co-Treat: Yes  Reason for Cotreatment: Poor activity tolerance for participation in multiple treatment sessions, Requires > mod assist for progressive mobility out of bed activities    Goals  Supine to Sit  Will perform supine to sit with: Minimal assist  Status: Progressing, ongoing    Sit to Supine  Will perform sit to supine with: Minimal assist  Status: Progressing, ongoing    Sit to/From Stand  Will perform sit to/from stand with: Minimal assist, Least restrictive device  Status: Progressing, ongoing  Bed to Chair  Will perform sit to/from chair with: Minimal assist, Least restrictive assistive device  Status: Progressing, ongoing  Gait Training  Will ambulate: 100-150 feet, Minimal assist, Least restrictive assistive device  Status: Progressing, ongoing    General Balance  Will participate in: Static, Dynamic, For 5 minutes, with minimal assist (LRAD)  Status: Progressing, ongoing  Home Exercise  Patient to complete HEP: Seated, Standing, With least restrictive assistive device, With minimal verbal cues  Status: Progressing, ongoing    POC DUE  11/22/22    Evaluation/Minutes  Time Initial Eval Initiated: 0956  Time Eval Completed: 1025    Total Evaluation Time: 29  Timed Code Minutes: 0 Minutes      Electronically signed by Marlis Edelson, PT at 11/08/2022 12:27 PM EST

## 2022-11-08 NOTE — Progress Notes (Signed)
Formatting of this note is different from the original.  Images from the original note were not included.  TR[facility]MA SURGERY PROGRESS NOTE    Name:  Janice Cunningham Date/Time of Admission: 11/06/2022  8:32 PM   CSN: [id number]  Attending Provider: Sofie Rower Pathmarajah,*   Room/Bed:  L3903/E0923-R DOB: 11/26/1926 88 y.o.      11/08/2022       Post Trauma Day #   2  Post Operative Day * No surgery found *     Events over last 24 hrs     Patient seen and examined.  Patient seen in her room is doing very well.  She is currently being managed for her COVID.    Current Meds:  Scheduled Meds:   amLODIPine  5 mg Oral DAILY    ascorbic acid (vitamin C)  500 mg Oral DAILY    chlorhexidine gluconate   Topical DAILY AT 12 NOON    cholecalciferol  5,000 Units Oral DAILY    enoxaparin (LOVENOX) injection (30 or 40 mg DVT Prophylaxis)  30 mg Subcutaneous TWO TIMES DAILY    guaiFENesin  600 mg Oral 2 times per day    magnesium sulfate  2 g Intravenous ONCE    potassium chloride  40 mEq Oral EVERY 6 HOURS    zinc sulfate  220 mg Oral DAILY     Continuous Infusions:  PRN Meds:.acetaminophen **OR** acetaminophen, benzonatate, docusate, ondansetron **OR** ondansetron     Objective:     Vital Signs:  Vitals:    11/08/22 1001   BP: (!) 150/77   Pulse:    Resp:    Temp:        I/O:    I/O last 3 completed shifts:  In: 240 [P.O.:240]  Out: 0    No intake/output data recorded.     PHYSICAL EXAM    Neurological GCS:  15   HEENT NCAT, Pupils PEARLA, Airway Patent    CSPINE Supple, No C spine pain, Painless ROM. No gross deformities or step offs.   Respiratory CTAB, Atraumatic, = Rise/Fall   Cardiovascular RRR   GI / Pelvis  Soft, NT, +BS / Atraumatic Stable   Back / Musculoskeletal No Midline Back Pain or tenderness or step offs / Moving all extremities, 2+ peripheral Pulses, good distal senstaion   Skin W/D     Labs:   Last CBC w diff Results:  Recent Labs   Lab 11/06/22  1543 11/07/22  0602 11/08/22  0548   WBC 5.9 7.1 7.2   HEMOGLOBIN 13.1  12.5 12.9   HCT 38.4 36.4 37.7   PLT 184 172 160   RBC 4.11 3.91 4.06   MCV 93.4 93.2 93.0   MCHC 34.1 34.3 34.1   MCH 31.8 32.0 31.7   RDW 13.5 13.6 13.3   NEUTROPHILS 71.1 74.8  --    MONOCYTES 12.9 10.2  --     Last Renal Function Results:  Recent Labs   Lab 11/06/22  1543 11/07/22  0602 11/08/22  0548   NA 139 139 137   K 3.9 3.6 3.4*   CL 103 104 103   CO2 27 25 24    BUN 22 24 23    CREATININE 1.18 1.16 1.13   GLU 114* 107 96   CALCIUM 9.3 8.9 8.5*   ALBUMIN 4.3 3.8  --    MG  --   --  1.8       Last Liver Function/Coags/Cardiac Results:  Recent  Labs   Lab 11/06/22  1543 11/07/22  0602   ALT 13 13   AST 18 20   BILIDIR 0.08  --     Microbiology:  Results for orders placed or performed during the hospital encounter of 11/06/22   Blood Culture    Specimen: Arm-Left; Blood   Result Value Ref Range    Blood Culture No Growth at 18-24 hrs.      No results found for this or any previous visit.      Imaging Studies: CT-ANGIO HEAD&NECK W AND/OR WO CON W/POST IMG    Result Date: 11/07/2022  IMPRESSION: 1.  No aneurysms, vascular occlusions, or intracranial stenoses identified. 2.  No significant stenosis in the extracranial vertebral or internal carotid arteries. 50% stenosis of left external carotid artery origin. Electronically Signed by: Lora Paula, MD, 11/07/2022 5:10 PM     XR-WRIST LEFT 2 VIEWS    Result Date: 11/07/2022  IMPRESSION: No acute osseous abnormality. Mild to moderate degenerative joint disease.    Electronically Signed by: Janit Pagan, MD, 11/07/2022 2:04 PM     XR-FOREARM LEFT 2 VIEWS    Result Date: 11/07/2022  IMPRESSION: No acute osseous abnormality or significant degenerative joint disease.    Electronically Signed by: Janit Pagan, MD, 11/07/2022 2:03 PM     XR-WRIST RIGHT 2 VIEWS    Result Date: 11/07/2022  IMPRESSION: No acute osseous abnormality. Mild degenerative joint disease.    Electronically Signed by: Janit Pagan, MD, 11/07/2022 2:02 PM     XR-FOREARM RIGHT 2 VIEWS    Result Date:  11/07/2022  IMPRESSION: No acute osseous abnormality or significant degenerative joint disease.    Electronically Signed by: Janit Pagan, MD, 11/07/2022 2:02 PM     XR-HIP BIL 3-4 VIEWS W/ PELVIS    Result Date: 11/07/2022  IMPRESSION: No acute osseous abnormality. Mild hip joint space narrowing most consistent with degenerative osteoarthritis.    Electronically Signed by: Janit Pagan, MD, 11/07/2022 2:01 PM       Assessment:     Principal Problem:    COVID-19    Plan:     Afebrile vital signs stable.  Imaging was all negative for any acute fractures.  Tertiary exam complete.  Imaging reviewed.  Will sign off at this time    Electronically signed by:  Rolm Gala, DO  11/08/2022       Electronically signed by Rolm Gala, DO at 11/08/2022 10:27 AM EST

## 2022-11-08 NOTE — Consults (Signed)
Associated Order(s): IP CONSULT TO PULMONOLOGY  Formatting of this note is different from the original.  [facility]    Nida Boatman, D.O., F.C.C.P.  Kallie Locks, M.D., Ph.D., F.C.C.P.  Marinda Elk, M.D., F.C.C.P.   Hollace Hayward, M.D., F.C.C.P.    Consult Note    Name:  Janice Cunningham Date/Time of Admission: 11/06/2022  8:32 PM    CSN: [id number] Attending Provider: Modesta Messing Pathmarajah,*   Room/Bed: W4132/G4010-U DOB: 1926/03/19 Age: 88 y.o.     REASON FOR CONSULT  COVID 19 Infection, cough, pulmonary congestion recent fall, background dementia    HPI  I was asked by Dr. Hilbert Corrigan,*  to see Janice Cunningham in consultation.  History was obtained from   [x]  patient   []  patient's family   [x]  chart review   [x]   nursing      [x]  Dr. Elenora Fender    Janice Cunningham is a 88 y.o. female who presents with COVID 19 Infection.  Patient has underlying dementia, UTI.  She reported generalized weakness and malaise.  She was thought to have developed UTI.  She came to the ER on 11/24 and tested positive for SARS-CoV-2 virus.  She was diagnosed with COVID-19.    Chest x-ray showed clear lung fields without acute infiltrates.  Patient was discharged home and then went to the bathroom and sustained a fall.  She presented back to the ER with temperature 100.6 F.  She was noticed to have some coughing episodes with mild congestion.  Trauma evaluation was performed and she did not have any evidence of acute fractures.    She was admitted for further management.  Patient did not require supplemental oxygen.  Labs showed WBC 7.2 without lymphopenia.  Hemoglobin 12.5 and platelets 172.  Sodium 139, potassium 3.6, BUN 24 creatinine 1.16.  Blood glucose was 107.  Urinalysis was negative    Patient has received the following for COVID-19:    Decadron: n/a  Convalescent Plasma: n/a  Remdesivir: n/a  Baricitinib: n/a    COVID-19 Symptom Onset: Few days prior to admission.   She reports COVID 19 exposure  COVID-19 Positive Testing: 11/06/22    COVID-19 Vaccination: Yes, multiple (received last COVID booster in 2022)    Family history is reviewed and noncontributory unless otherwise noted above in the HPI    Pertinent Imaging/Testing      [x]  Outside records were carefully reviewed.    REVIEW OF SYSTEMS    See HPI for further details. The rest of the complete review of systems [] otherwise negative   [x]  unobtainable due to mental state or inability to communicate.    PAST MEDICAL HISTORY  Past Medical History:   Diagnosis Date    Dementia ([place])     ESBL (extended spectrum beta-lactamase) producing bacteria infection 02/26/2021    Str. cath urine     SURGICAL HISTORY  Past Surgical History:   Procedure Laterality Date    HYSTERECTOMY       SOCIAL HISTORY  Social History     Socioeconomic History    Marital status: Widowed   Tobacco Use    Smoking status: Never    Smokeless tobacco: Never   Vaping Use    Vaping Use: Never used   Substance and Sexual Activity    Alcohol use: Not Currently    Drug use: Never    Sexual activity: Not Currently     FAMILY HISTORY  Noncontributory    CURRENT  MEDICATIONS  No outpatient medications have been marked as taking for the 11/06/22 encounter [facility] Encounter).      amLODIPine  5 mg Oral DAILY    ascorbic acid (vitamin C)  500 mg Oral DAILY    chlorhexidine gluconate   Topical DAILY AT 12 NOON    cholecalciferol  5,000 Units Oral DAILY    enoxaparin (LOVENOX) injection (30 or 40 mg DVT Prophylaxis)  30 mg Subcutaneous TWO TIMES DAILY    guaiFENesin  600 mg Oral 2 times per day    magnesium sulfate  2 g Intravenous ONCE    potassium chloride  40 mEq Oral EVERY 6 HOURS    zinc sulfate  220 mg Oral DAILY       ALLERGIES  Allergies   Allergen Reactions    Penicillins Rash     On 11/06/22 upon admission patient's daughter confirmed that pt has no allergies.     PHYSICAL EXAM    VITAL SIGNS: BP (!) 150/77   Pulse 86   Temp 98.1 F (36.7 C)   Resp 18    Ht 5\' 3"  (1.6 m)   Wt 112 lb 7 oz (51 kg)   SpO2 92%   BMI 19.92 kg/m          SOME/MOST/ALL PART OF THE EXAMINATION WERE DEFERRED TO REDUCE EXPOSURE RISK AND TO HELP CONSERVE PPE DURING THE COVID 19 PANDEMIC     Constitutional: Well developed, Well nourished.  Frail pleasant elderly female, sitting comfortably in chair, hard of hearing, background dementia  HENT: Head normocephalic, atraumatic; Bilateral external ears normal; Mucous membranes pink and moist; oropharynx without exudates.  Eyes: PERRL, Conjunctiva normal, No discharge. Anicteric sclera.  Neck: Supple, No tenderness, Trachea midline, no JVD   Lymphatic: No lymphadenopathy noted.   Cardiovascular: Normal heart rate, Normal rhythm, No murmurs, No rubs, No gallops.   Thorax & Lungs: Fair air entry, mild congested cough,  No wheezes. No rhonchi.  Few crackles/adventitial sounds that improved with coughing.  Symmetrical chest expansion, no accessory muscle use  Abdomen: Bowel sounds normal, Soft, No tenderness, No distension. No organomegaly, No pulsatile masses.   Skin: Warm, Dry, No erythema, No rash.   Extremities: No edema of lower extremities. No tenderness. No cyanosis. No clubbing.   Musculoskeletal: Reduced range of motion in all major joints. No tenderness to palpation or major deformities noted.  Wasting of small muscles of the hand  Neurologic: Awake, answers in single words, no gross focal deficits noted.   Psychiatric: background dementia    INTAKE/OUTPUT:  I/O last 3 completed shifts:  In: 240 [P.O.:240]  Out: 0   No intake/output data recorded.    LABS  Hematology  Recent Labs     11/06/22  1543 11/07/22  0602 11/08/22  0548   WBC 5.9 7.1 7.2   HEMOGLOBIN 13.1 12.5 12.9   HCT 38.4 36.4 37.7   PLT 184 172 160     No results for input(s): "PROTIME", "INR", "PTT" in the last 72 hours.    Chemistries  Recent Labs     11/06/22  1543 11/07/22  0602 11/08/22  0548   NA 139 139 137   K 3.9 3.6 3.4*   CL 103 104 103   CO2 27 25 24    BUN 22 24 23     CREATININE 1.18 1.16 1.13   GLU 114* 107 96     Recent Labs     11/06/22  1543 11/07/22  0602 11/08/22  0093   CALCIUM 9.3 8.9 8.5*   MG  --   --  1.8     LFTs  Recent Labs     11/06/22  1543 11/07/22  0602   AST 18 20   ALT 13 13     No results for input(s): "AMYLASE", "LIPASE" in the last 72 hours.    Arterial Blood Gasses  No results for input(s): "PH", "PCO2", "PO2", "O2SATURATION", "INSPIREDO2" in the last 72 hours.    Cardiac Enzymes  No results for input(s): "CREATPHOSKIN", "CKMBTOTAL", "CKMBISOSTOTA", "TROPONINI", "MYOGLOBIN", "BNP" in the last 72 hours.    Microbiology  Results for orders placed or performed during the hospital encounter of 11/06/22   Blood Culture    Specimen: Arm-Left; Blood   Result Value Ref Range    Blood Culture No Growth at 18-24 hrs.      Imaging  Chest imaging was personally reviewed.  CT-ANGIO HEAD&NECK W AND/OR WO CON W/POST IMG    Result Date: 11/07/2022  PROCEDURE: 1.  CT ANGIOGRAPHY HEAD WITH CONTRAST 2.  CT ANGIOGRAPHY NECK WITH CONTRAST INDICATION: Head trauma, minor (Age >= 65y), syncope, fall COMPARISON: CT head without contrast from the same day TECHNIQUE: CT angiogram of the head and neck was performed according to standard protocol. Axial images, multiplanar reformatted images, and maximum intensity projection images were reviewed for CT angiographic technique. Up-to-date CT equipment and radiation dose reduction techniques were employed. IV contrast: 65 mL Omnipaque 350 FINDINGS: Non-angiographic findings: There is biapical pleural thickening. CTA Neck: The visualized aortic arch appears normal. The configuration of the brachiocephalic vessels is typical. The innominate artery and both subclavian arteries appear normal. There is atherosclerotic disease at the bilateral carotid bifurcations extending into the origin of the internal carotid arteries without significant focal stenosis. Left external carotid artery demonstrates 50% stenosis at its origin. The cervical  internal carotid arteries appear normal. The cervical vertebral arteries appear normal. Left vertebral artery is dominant. CTA Head: The distal internal carotid arteries appear normal. The anterior and middle cerebral arteries appear normal. Distal right vertebral artery terminates as the PICA. The basilar artery and posterior cerebral arteries appear normal. No aneurysms, vascular occlusions, or intracranial stenoses are identified.     1.  No aneurysms, vascular occlusions, or intracranial stenoses identified. 2.  No significant stenosis in the extracranial vertebral or internal carotid arteries. 50% stenosis of left external carotid artery origin. Electronically Signed by: Gerald Leitz, MD, 11/07/2022 5:10 PM     XR-WRIST LEFT 2 VIEWS    Result Date: 11/07/2022  INDICATION: Fall at home. Left wrist pain. LEFT WRIST 2 views were performed. No comparison. Mineralization appears within normal limits. No evidence of acute fracture. No dislocation identified. Mild to moderate narrowing of radiocarpal space visualized. Moderate narrowing identified at first carpometacarpal articulation with marginal osteophyte formation noted. No pathologic calcification identified. Soft tissues are unremarkable.        No acute osseous abnormality. Mild to moderate degenerative joint disease.    Electronically Signed by: Vallarie Mare, MD, 11/07/2022 2:04 PM     XR-FOREARM LEFT 2 VIEWS    Result Date: 11/07/2022  INDICATION: Fall at home. Pain at left forearm. LEFT FOREARM 2 views were performed. No comparison. Mineralization appears within normal limits. No evidence of acute fracture. No dislocation identified. No significant degenerative joint space narrowing. No pathologic calcification identified. Soft tissues are unremarkable.        No acute osseous abnormality or significant degenerative joint disease.  Electronically Signed by: Glenn Taylor, MD, 11/07/2022 2:03 PM     XR-WRIST RIGHT 2 VIEWS    Result Date:  11/07/2022  INDICATION: Follow-up home. Pain in right wrist. RIGHT WRIST 2 views were performed. No comparison. Mineralization appears within normal limits. No evidence of acute fracture. No dislocation identified. Mild radiocarpal joint space narrowing identified. Mild narrowing at first carpometacarpal articulation also noted. No pathologic calcification identified. Soft tissues are unremarkable.        No acute osseous abnormality. Mild degenerative joint disease.    Electronically Signed by: Glenn Taylor, MD, 11/07/2022 2:02 PM     XR-FOREARM RIGHT 2 VIEWS    Result Date: 11/07/2022  INDICATION: Fall at home. Pain RIGHT FOREARM 2 views were performed. No comparison. Mineralization appears within normal limits. No evidence of acute fracture. No dislocation identified. No significant degenerative joint space narrowing. No pathologic calcification identified. Soft tissues are unremarkable.        No acute osseous abnormality or significant degenerative joint disease.    Electronically Signed by: Glenn Taylor, MD, 11/07/2022 2:02 PM     XR-HIP BIL 3-4 VIEWS W/ PELVIS    Result Date: 11/07/2022  INDICATION: Traumatic injury. Pain. PELVIS AND BILATERAL HIPS 3 views were performed. No comparison. Mineralization appears within normal limits. No evidence of acute fracture. No dislocation identified. Mild bilateral hip degenerative joint space narrowing is identified. No pathologic calcification identified. Soft tissues are unremarkable.        No acute osseous abnormality. Mild hip joint space narrowing most consistent with degenerative osteoarthritis.    Electronically Signed by: Glenn Taylor, MD, 11/07/2022 2:01 PM     XR-CHEST PORTABLE STAT    Result Date: 11/06/2022  EXAM: PORTABLE AP CHEST X-RAY INDICATION: altered mental status,    COMPARISON: none FINDINGS:     There is no focal consolidation, pleural effusion, or pneumothorax. The cardiomediastinal silhouette is normal. The visible bony thorax is intact.     No  acute pulmonary disease. Electronically Signed by: John B Tezel, MD, 11/06/2022 3:56 PM     ASSESSMENT  COVID-19 viral infection  Cough with mild pulmonary congestion  Generalized weakness and malaise  Recent fall  Dementia  Dehydration  Mild acute kidney injury  Advanced age  Hypertension    PLAN  Patient seen and evaluated chart reviewed  Agree with admission  Patient remains on room air.  Okay to use oxygen as needed to keep SPO2 greater than 89%.  No indication for Decadron, convalescent plasma, or Remdesivir.  She may have been a good candidate for outpatient Paxlovid.  Aspiration precautions  Tylenol for fever.  Avoid ibuprofen  Self proning was encouraged  Cough suppressants as needed   Pulmonary toilet  Vitamin D vitamin C and zinc will be continued  Follow inflammatory markers  Nutritional rehabilitation  Encourage p.o. hydration.  Gentle IV hydration can be started with close monitoring of renal function   DVT/GI prophylaxis  Hopefully patient can be discharged home to follow isolation guidelines.  Recommend COVID-19 booster dose 60-90 days after most recent infection    I personally reviewed chart, labs, and imaging, interviewed and examined the patient, formulated assessment and plan.    Case reviewed with  [x] patient, nursing staff and Dr. Pathmarajah.    Thank you very much for the opportunity to participate in care of Shamiyah M Donavan.    Electronically signed: Oluwole O.A. Onadeko, MD 11/08/2022  11:00 AM   Electronically signed by Oluwole O.A. Onadeko, MD at   11/08/2022 11:17 AM EST

## 2022-11-08 NOTE — Progress Notes (Signed)
Formatting of this note is different from the original.   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  has a past surgical history that includes Hysterectomy.       OT Discharge Recommendations  1. Occupational Therapy services recommended to continue after discharge: Yes  2. Supervision for safety required: Full time supervision required: Unobserved for brief periods of time only but not left alone  3. Physical assistance required for daily care: Maximum Physical Assistance required: Caregiver supplies 51-75% of physical assistance   4. OT Discharge Equipment Needs If Returning Home: Other (Comment) (Continue to assess)       Functional Outcomes/Testing and Reporting    Daily Activity AM-PAC 6 Click: 10    General  - Subjective: Pt states, "I can't hear you".    - Current Medical Status: Janice Cunningham is a 88 y.o. female who presents to the Emergency Department for evaluation of lethargy and increased confusion.  Family reports that the patient ate very little yesterday and was not able to get up and move around and slept most of the day.  Pt positive for COVID. Pt was DC'd home and experienced fall. Returns to ED and admitted.    - Testing/Imaging: CT-ANGIO HEAD&NECK   IMPRESSION: 1.  No aneurysms, vascular occlusions, or intracranial stenoses identified. 2.  No significant stenosis in the extracranial vertebral or internal carotid arteries. 50% stenosis of left external carotid artery origin. XR-WRIST LEFT 2 VIEWS   IMPRESSION: No acute osseous abnormality. Mild to moderate degenerative joint disease.  XR-FOREARM LEFT 2 VIEWS IMPRESSION: No acute osseous abnormality or significant degenerative joint disease.  XR-WRIST RIGHT 2 VIEWS  IMPRESSION: No acute osseous abnormality. Mild degenerative joint disease. XR-FOREARM RIGHT 2 VIEWS IMPRESSION: No acute osseous abnormality or significant degenerative joint disease.     XR-HIP BIL 3-4 VIEWS W/ PELVIS   IMPRESSION: No acute osseous abnormality. Mild hip joint space narrowing most consistent with degenerative osteoarthritis.    - Past therapy for current diagnosis, illness or injury? No      Weightbearing Status  Acute Weight Bearing Status: Full Weight Bearing (No restrictions ordered)    Therapy Precautions  Fall Risk  Additional Comments:     Orthotics/Braces      Home Environment  Source: Patient is unreliable historian (Pt with dementia at baseline and HOH, unable to get PLOF)                  Home Equipment       Prior Functional Level                Premorbid Communication: Wears hearing aids, Hearing impaired, Wears glasses/contacts  Premorbid Cognition: Impaired    Session Beginning Location, Safety Info  Lines, Drains, Monitors, Equipment: Telemetry    Upon Arrival Patient Location: In bed (Pt attempting to get out of bed upon therapy arrival, noting B LE over bed rail.)   Upon Arrival Safety: Bed alarm on, Vitals stable     Pain Assessment  Pain Score: Patient unable to  rate but no noted signs of pain  Pain Location: N/A  Pain Intervention: Patient reports pain tolerable level for therapy    Extremity Assessment - Unable to formally assess ROM and MMT d/t poor command following and pt being HOH, based on skilled observation, ROM WFL, MMT 3/5     RUE AROM PROM Strength   Shoulder     3   Elbow     3   Wrist         Pronate         Supinate         Hand     3         LUE AROM PROM Strength   Shoulder     3   Elbow     3   Wrist         Pronate         Supinate         Hand     3           Task Current Assist Level Devices, Other Details   Grooming Standby  supervision    UB Dressing Total assist      LB Dressing Total assist      Bathing Total assist      Toileting Total assist      Bed Mobility Other (comment) (MOD A x2)    Sit to/from Stand Maximal assist    FWW    Toilet Transfer              Activities of Daily Living and Functional Mobility    Unless otherwise specified, above values represent clinical judgment based on simulated activity participation during session.     Feeding Assistance  Feeding Assistance Level: Set up supervision  Assistance Required for: Supervision/safety        Grooming Assistance  Grooming Assistance Level: Standby supervision  Assistance Required for: Supervision/safety        Upper Body Dressing  UB Dressing Assistance: Total assist  Assistance Required for: Pull over head, Thread RUE, Verbal cueing  UB Dressing Tasks Completed: Pt partially pulled off gown prior to therapy entering room, requiring therapy to cue and physically assisting with donning gown back on  Position:  (Supine)    Lower Body Dressing  LB Dressing Assistance Level: Total assist          Bathing Assistance  Bathing Assistance Level: Total assist          Toileting Assistance  Toileting  Assistance Level: Total assist  Assistance Required for: Full brief change due to incontinence, Supervision/safety  Toileting Tasks Completed: External urinary catheter placed prior to therapy arrival, noting soiled brief. External urinary catheter remove and soiled brief doffed in stance, placing clean brief on chair and guiding patient's hips on recliner.    Bed Mobility  Bed Mobility Assistance Level: Other (comment) (MOD A x2)  Assistance Required for: HOB elevated, Increased time to complete, Supervision/safety, Verbal cues for technique, Initiation, Sequencing, Moving LLE to edge of bed, Moving RLE to edge of bed, Trunk  Bed Mobility Tasks Completed: Therapy initiating B LE movement 2/2 HOH, with pt requring assistance for trunk and B LE. Once seated EOB, pt with slight  posterior lean, requiring cues for upright sitting balance.    Sit to from Stand  Sit to Stand  Assistance Level: Maximal assist  Assistance Required for: Hand placement, Proper technique, Supervision/safety  Assistive Devices: FWW    Functional Mobility  Functional Mobility Assistance: Total assist (MIN A x2 progressing to MOD A x2)  Assistance Required for: Verbal cueing, Supervision/safety, Increased time to complete/slow pace, Sequencing, Problem solving  Assistive Devices: FWW  Functional Tasks Completed: Once standing, pt cued to ambulate to recliner. Pt requiring increased time for motor planning, with pt noting to ambulate ~15 seconds after cue. Small steps noted, with pt stopping during ambulation and further knee flexion noted. Recliner brought closer to patient d/t safety awareness and increased fatigue.    Oxygen Administration Details  Oxygen (L/m): 0  Oxygen Delivery Device: RA    Pertinent Medication/Information  No s/s of distress. As therapy was donning proper PPE, pt noted to attempt getting out of bed, with therapy entering room to assist. Therapy assisted with donning hearing aids at start of the session. RN present in room shortly after therapy arrival to take BP supine, 150/77.  Patient reporting dizziness when seated EOB, BP 158/70. Wet cough noted throughout evaluation.     Cognition  Mental State: Alert  Social Interaction Skills: Calm, Cooperative, Delayed  Follows Commands: 1 step, Inconsistently, Delayed  Orientation: Person  Additional Cognitive Concerns Noted: Insight, Motor Planning, Memory, Problem Solving, Safety Awareness/Judgement, Sequencing, Initiation, Attention Span      Visual Processing  Current Vision: Wears glasses all the time                  Sensation  Gross Sensation: Not tested    Motor Coordination  Fine Motor: Functional  Gross Motor: Functional    Session Ending Location, Safety Info  End of Session Patient Location: Sitting in chair  End of Session Patient Safety:  Chair alarm on, Gait belt used, Call light within reach, Phone within reach, External urinary catheter removed (Nursing staff aware)   Interdisciplinary Communication: Spoke with RN prior to session, RN updated on patient performance, Spoke with PT    Patient/Caregiver Education  Education Provided: Role of OT, Education about rehab diagnosis, Use of call light for assistance  Patient Education Completed: Requires continued education  Family/Caregiver Education Completed: No family/caregiver present    Assessment  Problem List/Deficits: Grooming, Dressing, Toileting, Bathing, Functional mobility, Balance, Safety/judgement, Feeding  Rehab Diagnosis: Assistance with personal care  Rehab Potential to Meet Established Goals: Fair    Plan  OT Patient/Family Goal: To go back home  Treatment Interventions to Address Currrent Goals: Self care/Home Management, Therapeutic activities, Therapeutic exercise   OT Frequency: 5x/wk    OT Duration of Treatment: 2 weeks  OT Co-Treat: Yes   Reason for Cotreatment: Requires > mod assist for progressive mobility out of bed activities    Goals  OT POC Due: 11/22/22  Feeding  Will complete feeding with: Independence  Feeding Goal Status: Progressing, ongoing    Grooming  Will complete grooming/simple hygiene with: Distant supervision (Seated)  Grooming Goal Status: Progressing, ongoing    Upper Body Dressing  Will complete upper body (UB) dressing with: Minimal assist  UB Dressing Goal Status: Progressing, ongoing    Lower Body Dressing  Will complete lower body (LB) dressing with: Maximal assist  LB Dressing Goal Status: Progressing, ongoing    Bathing  Will complete bathing with: Maximal assist  Bathing Goal Status: Progressing, ongoing    Toileting  Will complete toileting with: Maximal assist  Toileting Goal Status: Progressing, ongoing    Bed Mobility  Will complete bed mobility with: Moderate assist  Bed Mobility Goal Status: Progressing, ongoing    Sit to/fom Stand  Will perform  sit to/from stand with: Moderate assist, Least restrictive assistive device  Sit to/from Stand Goal Status: Progressing, ongoing    Functional Mobility  Patient will perform functional mobility task with: Moderate assist, Least restrictive assistive device  Functional Mobility Goal Status: Progressing, ongoing   Toilet Transfer  Patient will perform toilet transfer: Moderate assist, Least restrictive assistive device  Toilet Transfer Goal Status: Progressing, ongoing    Home Exercise  To complete HEP : With minimal verbal cues  HEP Goal Status: Progressing, ongoing      Evaluation/Minutes  Date Initial Eval Completed: 11/08/22  Time Initial Eval Initiated: 0954  Time Eval Completed: 1025  Total Evaluation Time: 31        Electronically signed by Windy Fast, MOT, OTR/L at 11/08/2022 12:32 PM EST

## 2022-11-09 NOTE — Progress Notes (Signed)
Formatting of this note might be different from the original.  Images from the original note were not included.        Inpatient Wound & Ostomy "Found Down" Chart Audit    BPA received on patient from Found Down List    Chart audit completed on patient at this time.  Patient admitted to [facility] for fall.  Patient noted to be not be found down. Patient's Braden Score is 16.    Prevention Measures: If Braden 18 or less    [x]  Skin assessment along with Braden Scale assessment every 8 hours & PRN                                           [x]  Turn every 2 hours & PRN            [x]  Elevate heels off bed with pillow  []  Bilateral heel lift boots while in bed   []  Clean with Remedy Clinical Cleanse No-Rinse Foam Cleanser               []  Apply Remedy Clinical Moisturize Skin Cream (purple top) to arms, legs & feet every shift & PRN                                              [x]  Apply Remedy Clinical Prevent Silicone D cream (blue top) to buttocks every shift & PRN        []  Apply Remedy Clinical Protect Zinc Oxide Paste Skin Protectant (orange top) to buttocks every shift & PRN                                          [x]  Limit use of briefs while in bed                                                   []  Apply ear saver protectors to oxygen tubing                                                 []  For Bipap use-bipap gel pad for protection on bridge of nose.  []  Apply Duoderm dots to bilateral cheeks under oxygen tubing for protection (Duoderm dots [id number]# [id number])  []  Consult Nutrition   []  Wound Care Consulted    Kimberly Gray, BSN, RN, CWON  Inpatient WOC Team     WOC Team Phone- [phone]  Phone Ext- [phone]  Electronically signed by Kimberly Renee Gray, RN at 11/09/2022  7:50 AM EST

## 2022-11-09 NOTE — Progress Notes (Signed)
Formatting of this note might be different from the original.                                                Discharge Planning Brief Note    Plan A: referral to [facility] 11/26    Plan B:     Reason for Intervention: Spoke with Chasity, admissions at [facility]. They are working on obtaining an out of network insurance approval for pt. Since pt is going to be a long-term resident in their A.L, Chasity states that this is generally not a problem to obtain. She states the precert is currently in process. She is aware that pt is medically ready for discharge.  Electronically signed by Ilyse E Wells, MSW, LISW-S at 11/09/2022 11:49 AM EST

## 2022-11-09 NOTE — Consults (Signed)
Associated Order(s): IP CONSULT TO PALLIATIVE CARE  Formatting of this note is different from the original.  Palliative Care Consult    Name: Janice Cunningham      Room/Bed: B0175/Z0258-N  DOB: 17-Sep-1926         88 y.o.    11/09/2022 10:02 AM  Palliative Care Consult requested by S. Pathmarajah    HCPOA/Family contact: Jearld Fenton (daughter) [phone]  Code Status:  FULL    Reason for consultation:     Goals of care    History Present Illness     Patient is a 88 year old female with a past medical history of dementia, ESBL and hypertension who presented to the ED with weakness.  Patient found to be COVID-positive.  Palliative care has been consulted for goals of care discussion.    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 cath urine     Past Surgical History:   Procedure Laterality Date    HYSTERECTOMY       Allergies   Allergen Reactions    Penicillins Rash     On 11/06/22 upon admission patient's daughter confirmed that pt has no allergies.     History reviewed. No pertinent family history.   Social History     Socioeconomic History    Marital status: Widowed     Spouse name: Not on file    Number of children: Not on file    Years of education: Not on file    Highest education level: Not on file   Occupational History    Not on file   Tobacco Use    Smoking status: Never    Smokeless tobacco: Never   Vaping Use    Vaping Use: Never used   Substance and Sexual Activity    Alcohol use: Not Currently    Drug use: Never    Sexual activity: Not Currently   Other Topics Concern    Not on file   Social History Narrative    Not on file     Social Determinants of Health     Financial Resource Strain: Not on file   Food Insecurity: Not on file   Transportation Needs: Not on file   Physical Activity: Not on file   Stress: Not on file   Social Connections: Not on file   Intimate Partner Violence: Not on file    Housing Stability: Not on file     Current Symtpoms     Current Symptoms:    (0-none, 1-mild, 2-moderate, 3-severe; if moderate to severe, describe below).    Depression:  0  Anorexia: 2  Inactivity: 1  Dyspnea:  0  Anxiety: 0  Nausea: 0  Drowsiness: 0  Constipation: 0  Agitation: 0  Physical Discomfort: 0  Fatigue: 2  Diarrhea: 0    Objective:      Vital Signs:BP: 145/70 (11/27 0938)  Temp: 98.4 F (36.9 C) (11/27 6144)  Pulse: 92 (11/27 0938)  Resp: 18 (11/27 0938)  SpO2: 92 % (11/27 0938)  FiO2 (%): --  O2 Flow Rate (L/min): --  Cardiac (WDL): --  Cardiac Rhythm: --  Last Filed Weights    11/06/22 2248   Weight: 112 lb 7 oz (51 kg)    Body mass index is 19.92 kg/m. Temp (48hrs), Avg:98.6 F (37 C), Min:97.8 F (36.6 C), Max:99.5 F (37.5 C)  Dementia: yes  Delirium: no  Coma: no  Fall Risk: yes    Physical Exam:      General Appearance Awake, confusion, no acute distress, frail appearance   HEENT Normocephalic, atraumatic, PERRL, conjuctiva pink/moist   Cardiovascular Regular rate, rhythm, S1S2 normal, no murmur or rub   Pulmonary Even expansion, no accessory muscle use.    Gastrointestinal BS x 4+, soft, non-tender, non-distended   EXT No cyanosis/clubbing/edema   SKIN Warm/dry/intact     Labs:   Recent Labs     11/06/22  1543 11/07/22  0602 11/08/22  0548 11/09/22  0358   WBC 5.9 7.1 7.2 8.6   HEMOGLOBIN 13.1 12.5 12.9 13.1   HCT 38.4 36.4 37.7 38.1   PLT 184 172 160 173   NA 139 139 137 136   K 3.9 3.6 3.4* 4.2   CL 103 104 103 105   CO2 27 25 24 21    BUN 22 24 23  26*   CREATININE 1.18 1.16 1.13 1.13   GLU 114* 107 96 120*   ALBUMIN 4.3 3.8  --   --    MG  --   --  1.8 2.1     MEDS:   Current Facility-Administered Medications:     acetaminophen (TYLENOL) tablet 500-1,000 mg, 500-1,000 mg, Oral, EVERY 8 HOURS PRN, 1,000 mg at 11/07/22 2318 **OR** acetaminophen (TYLENOL) tablet 650 mg, 650 mg, Oral, EVERY 6 HOURS PRN, Keri Nicole Hollencamp, APRN-CNP, 650 mg at 11/08/22 1142    [START ON 11/10/2022]  amLODIPine (NORVASC) tablet 10 mg, 10 mg, Oral, DAILY, Sivani 11/10/22, MD    ascorbic acid (vitamin C) (VITAMIN C) tablet 500 mg, 500 mg, Oral, DAILY, Sivani 11/12/2022, MD, 500 mg at 11/09/22 Kristeen Miss    benzonatate (TESSALON) capsule 200 mg, 200 mg, Oral, EVERY 8 HOURS PRN, Keri Nicole Hollencamp, APRN-CNP    chlorhexidine gluconate 2 % cloth, , Topical, DAILY AT 12 NOON, Keri Nicole Hollencamp, APRN-CNP, Given at 11/08/22 1200    cholecalciferol (VITAMIN D3) capsule 5,000 Units, 5,000 Units, Oral, DAILY, Sivani [id number], MD, 5,000 Units at 11/09/22 0939    docusate (COLACE) capsule 100 mg, 100 mg, Oral, TWO TIMES DAILY PRN, Keri Nicole Hollencamp, APRN-CNP    enoxaparin (LOVENOX) injection 30 mg, 30 mg, Subcutaneous, TWO TIMES DAILY, Keri Nicole Hollencamp, APRN-CNP, 30 mg at 11/09/22 0939    guaiFENesin (MUCINEX) extended-release tablet 600 mg, 600 mg, Oral, 2 times per day, Keri Nicole Hollencamp, APRN-CNP, 600 mg at 11/09/22 0939    ondansetron (ZOFRAN) injection 4 mg, 4 mg, Intravenous, EVERY 4 HOURS PRN **OR** ondansetron (ZOFRAN-ODT) disintegrating tablet 4 mg, 4 mg, Oral, EVERY 4 HOURS PRN, Keri Nicole Hollencamp, APRN-CNP    zinc sulfate (ZINCATE) capsule 220 mg, 220 mg, Oral, DAILY, Sivani 11/11/22, MD, 220 mg at 11/09/22 Kristeen Miss     MEDSPRN: acetaminophen **OR** acetaminophen, benzonatate, docusate, ondansetron **OR** ondansetron    MEDSINFUSIONS:         Imaging Studies: Reviewed in chart  Last Imaging results   Results for orders placed or performed during the hospital encounter of 11/06/22   CT-ANGIO HEAD&NECK W AND/OR WO CON W/POST IMG    Narrative    PROCEDURE:  1.  CT ANGIOGRAPHY HEAD WITH CONTRAST  2.  CT ANGIOGRAPHY NECK WITH CONTRAST    INDICATION: Head trauma, minor (Age >= 65y), syncope, fall    COMPARISON: CT head without contrast from the same day    TECHNIQUE: CT angiogram of the  head and neck was performed according to standard protocol. Axial images, multiplanar  reformatted images, and maximum intensity projection images were reviewed for CT angiographic technique. Up-to-date CT equipment and radiation dose reduction techniques were employed.    IV contrast: 65 mL Omnipaque 350    FINDINGS:    Non-angiographic findings:     There is biapical pleural thickening.    CTA Neck:    The visualized aortic arch appears normal. The configuration of the brachiocephalic vessels is typical. The innominate artery and both subclavian arteries appear normal. There is atherosclerotic disease at the bilateral carotid bifurcations extending into the origin of the internal carotid arteries without significant focal stenosis. Left external carotid artery demonstrates 50% stenosis at its origin. The cervical internal carotid arteries appear normal. The cervical vertebral arteries appear normal. Left vertebral artery is dominant.    CTA Head:    The distal internal carotid arteries appear normal. The anterior and middle cerebral arteries appear normal. Distal right vertebral artery terminates as the PICA. The basilar artery and posterior cerebral arteries appear normal. No aneurysms, vascular occlusions, or intracranial stenoses are identified.     Impression    1.  No aneurysms, vascular occlusions, or intracranial stenoses identified.  2.  No significant stenosis in the extracranial vertebral or internal carotid arteries. 50% stenosis of left external carotid artery origin.    Electronically Signed by: Gerald LeitzJohn B Tezel, MD, 11/07/2022 5:10 PM    XR-HIP BIL 3-4 VIEWS W/ PELVIS    Narrative    INDICATION: Traumatic injury. Pain.    PELVIS AND BILATERAL HIPS    3 views were performed. No comparison. Mineralization appears within normal limits. No evidence of acute fracture. No dislocation identified. Mild bilateral hip degenerative joint space narrowing is identified. No pathologic calcification identified. Soft tissues are unremarkable.         Impression    No acute osseous abnormality. Mild hip  joint space narrowing most consistent with degenerative osteoarthritis.        Electronically Signed by: Vallarie MareGlenn Taylor, MD, 11/07/2022 2:01 PM    XR-WRIST RIGHT 2 VIEWS    Narrative    INDICATION: Follow-up home. Pain in right wrist.    RIGHT WRIST    2 views were performed. No comparison. Mineralization appears within normal limits. No evidence of acute fracture. No dislocation identified. Mild radiocarpal joint space narrowing identified. Mild narrowing at first carpometacarpal articulation also noted. No pathologic calcification identified. Soft tissues are unremarkable.         Impression    No acute osseous abnormality. Mild degenerative joint disease.        Electronically Signed by: Vallarie MareGlenn Taylor, MD, 11/07/2022 2:02 PM    XR-WRIST LEFT 2 VIEWS    Narrative    INDICATION: Fall at home. Left wrist pain.    LEFT WRIST    2 views were performed. No comparison. Mineralization appears within normal limits. No evidence of acute fracture. No dislocation identified. Mild to moderate narrowing of radiocarpal space visualized. Moderate narrowing identified at first carpometacarpal articulation with marginal osteophyte formation noted. No pathologic calcification identified. Soft tissues are unremarkable.         Impression    No acute osseous abnormality. Mild to moderate degenerative joint disease.        Electronically Signed by: Vallarie MareGlenn Taylor, MD, 11/07/2022 2:04 PM    XR-FOREARM RIGHT 2 VIEWS    Narrative    INDICATION: Fall at home. Pain    RIGHT FOREARM  2 views were performed. No comparison. Mineralization appears within normal limits. No evidence of acute fracture. No dislocation identified. No significant degenerative joint space narrowing. No pathologic calcification identified. Soft tissues are unremarkable.         Impression    No acute osseous abnormality or significant degenerative joint disease.        Electronically Signed by: Glenn Taylor, MD, 11/07/2022 2:02 PM    XR-FOREARM LEFT 2 VIEWS    Narrative     INDICATION: Fall at home. Pain at left forearm.    LEFT FOREARM    2 views were performed. No comparison. Mineralization appears within normal limits. No evidence of acute fracture. No dislocation identified. No significant degenerative joint space narrowing. No pathologic calcification identified. Soft tissues are unremarkable.         Impression    No acute osseous abnormality or significant degenerative joint disease.        Electronically Signed by: Glenn Taylor, MD, 11/07/2022 2:03 PM        Spiritual assessment     Seen per chaplain no  Spiritual needs yes    Impression     This patient was seen in full proper PPE with a 95 mask, goggles, and gloves and with Foam in and foam out before and after the encounter and follow [facility] protocol    This is a 88-year-old female with a past medical history of dementia, ESBL and hypertension who presented to the ED with weakness.  Patient is seen sitting up in the chair awake.  Patient appears confused.  Patient is nonverbal with writer.    Patient is currently on room air with saturation 90%.  Vitals are stable.  Most recent BP 145/70, receiving an hypertensives.  Patient originally presented with weakness.  Patient found to be COVID-positive.  Patient is receiving vitamin D, C and zinc.  WBC count 8.6.  Pulmonology following.  Patient with poor intake.  Patient on regular diet.  Albumin 3.8.  Patient is weak, PT/OT services in place.  Patient appears comfortable at this time.    Patient does not have living will or healthcare power of attorney paperwork on file.  Per her daughters patient's daughter Alberto McCain is her medical decision-maker.  Extensive discussion with patient daughter Alberta related to goals of care and current medical health status, verbalized understanding.  Patient current FULL CODE STATUS.  Discussion regarding CPR and/or intubation if needed an emergent situation.  Writer strongly recommended DNR CCA, no intubation CODE  STATUS to patient's daughter, patient's daughter agreeable and verbalized understanding.  Paperwork completed.    Patient comes from private residence where she lives alone.  Recommend skilled services following hospital discharge.  Family voiced that they would like patient to discharge to [facility].  Recommend palliative services following hospital discharge, will follow up.  Writer to continue discussions related to goals of care.  LSW aware.    PPS 50%    Plan     1.  Changed to DNR CCA, no intubation CODE STATUS, confirmed with patient's daughter Alberta  2.  Agree with current plan of care  3.  Continue isolation precautions  4.  Continue PT/OT services  5.  Recommend skilled services following hospital discharge  6.  Recommend palliative services following hospital discharge, will follow up  7.  Consult pastoral services for supportive care  8.  Writer to continue discussions related to goals of care  9.  Palliative care services will continue   to follow    Thank you for allowing the palliative care team to participate in the care of this patient.    Greater than 50% of time during encounter was spent counseling and/or coordination of care as documented above.    Claudette Head, APRN-CNP      Electronically signed by Arlyss Repress, DO at 11/09/2022 12:55 PM EST

## 2022-11-09 NOTE — Progress Notes (Signed)
Formatting of this note is different from the original.  Hospitalist Progress Note    Patient Name:  Janice Cunningham     Date:  11/09/2022    Admitted complain:  SOB    Active Problem/s:  COVID infection    Subjective:  Pt was seen in the room  She feels better than yesterday  She is on RA  Awaiting to be d/cd to ECF.  Pt is in the COVID isolation unit. To minimize the exposure,pt was examined with some limitations.  Objective:  Physical Exam:    BP (!) 145/70   Pulse 92   Temp 98.4 F (36.9 C)   Resp 18   Ht 5\' 3"  (1.6 m)   Wt 112 lb 7 oz (51 kg)   SpO2 92%   BMI 19.92 kg/m   BP (!) 145/70   Pulse 92   Temp 98.4 F (36.9 C)   Resp 18   Ht 5\' 3"  (1.6 m)   Wt 112 lb 7 oz (51 kg)   SpO2 92%   BMI 19.92 kg/m   General appearance: alert.  Lungs: clear to auscultation bilaterally  Heart: regular rate and rhythm, S1, S2 normal, no murmur, click, rub or gallop  Abdomen: soft, non-tender; bowel sounds normal; no masses,  no organomegaly  Extremities: extremities normal, atraumatic, no cyanosis or edema  Neurologic: Grossly normal    Labs:   CBC:   Recent Labs     11/07/22  0602 11/08/22  0548 11/09/22  0358   WBC 7.1 7.2 8.6   HEMOGLOBIN 12.5 12.9 13.1   HCT 36.4 37.7 38.1   MCV 93.2 93.0 92.9   PLT 172 160 173     BMP:   Recent Labs     11/07/22  0602 11/08/22  0548 11/09/22  0358   NA 139 137 136   K 3.6 3.4* 4.2   CL 104 103 105   CO2 25 24 21    BUN 24 23 26*   CREATININE 1.16 1.13 1.13     LIVER PROFILE:   Recent Labs     11/06/22  1543 11/07/22  0602   AST 18 20   ALT 13 13   BILIDIR 0.08  --      PT/INR:  No results found for: "PROTIME", "INR"  PTT:  No results found for: "APTT"  UA: No results for input(s): "NITRITE", "COLORU", "PHUR", "MUCUS", "TRICHOMONAS", "YEAST", "BACTERIA", "SPECGRAV", "UROBILINOGEN", "GLUCOSEU", "KETONESU", "AMORPHOUS" in the last 72 hours.    Invalid input(s): "LABCAST", "WBCUA", "RBCUA", "CLARITYU", "LEUKOCYTESUR", "BILIRUBINUR", "BLOODU"    Imaging:  CT-ANGIO HEAD&NECK W  AND/OR WO CON W/POST IMG    Result Date: 11/07/2022  IMPRESSION: 1.  No aneurysms, vascular occlusions, or intracranial stenoses identified. 2.  No significant stenosis in the extracranial vertebral or internal carotid arteries. 50% stenosis of left external carotid artery origin. Electronically Signed by: Lora Paula, MD, 11/07/2022 5:10 PM     XR-WRIST LEFT 2 VIEWS    Result Date: 11/07/2022  IMPRESSION: No acute osseous abnormality. Mild to moderate degenerative joint disease.    Electronically Signed by: Janit Pagan, MD, 11/07/2022 2:04 PM     XR-FOREARM LEFT 2 VIEWS    Result Date: 11/07/2022  IMPRESSION: No acute osseous abnormality or significant degenerative joint disease.    Electronically Signed by: Janit Pagan, MD, 11/07/2022 2:03 PM     XR-WRIST RIGHT 2 VIEWS    Result Date: 11/07/2022  IMPRESSION: No acute osseous abnormality. Mild  degenerative joint disease.    Electronically Signed by: Vallarie Mare, MD, 11/07/2022 2:02 PM     XR-FOREARM RIGHT 2 VIEWS    Result Date: 11/07/2022  IMPRESSION: No acute osseous abnormality or significant degenerative joint disease.    Electronically Signed by: Vallarie Mare, MD, 11/07/2022 2:02 PM     XR-HIP BIL 3-4 VIEWS W/ PELVIS    Result Date: 11/07/2022  IMPRESSION: No acute osseous abnormality. Mild hip joint space narrowing most consistent with degenerative osteoarthritis.    Electronically Signed by: Vallarie Mare, MD, 11/07/2022 2:01 PM     XR-CHEST PORTABLE STAT    Result Date: 11/06/2022  IMPRESSION: No acute pulmonary disease. Electronically Signed by: Gerald Leitz, MD, 11/06/2022 3:56 PM      Assessment:  Principal Problem:    COVID-19      Plan:    COVID Infection.  Tested positive : 11/06/22  Admitted  to COVID isolation unit  Placed on Vitamin C, Vitamin D, & Zinc   Pulmonary  consulted  Hypertensive urgency  Continue  norvasc.  Acute Metabolic Encephalopathy  Agitated, treated with haldol  Dementia  HTN  Continue  norvasc.  Fall at  home  Diarrhea  Hypokalemia.  K was replaced..  Hypomagnesemia.  Magnesium was replaced.  Advanced age  DVT prophylaxis.  Patient was placed on lovenox subcutaneous bid.  DC Planning.  PT/OT/SS to see for the d/c planning.  To ECF    High risk for clinical decline    Electronic Signature:  Wynona Luna, MD 11/09/2022 9:59 AM     Due to the current situation with the COVID - 19 pandemic, to minimize the exposure, the chart in Epic was reviewed. Based on the chart review, discussion with nursing staff & pulmonary service medical decision was made.        Electronically signed by Wynona Luna, MD at 11/09/2022 12:51 PM EST

## 2022-11-09 NOTE — Care Plan (Signed)
Formatting of this note might be different from the original.  End of Shift and Plan of Care Summary  Patient A&Ox2-3 w/intermittent confusion. Patient able to communicate needs. Family at bedside at HS. Modified transmission based precautions in place. VSS, Respirations even and unlabored. Afebrile. No SOB. Non productive cough present occasionally. No S/S of acute distress noted.     LR x500mL completed. Right AC peripheral IV access line remains intact, patent, C/D.     No c/o pain t/o shift.     Patient incontinent of bladder. External catheter placed.    Fall Prevention: safety maintained, fall risk protocol in place. Bed in lowest position with wheels locked. Yellow socks and bed alarm on for safety.  Side rails up x2. Call light and bedside table within reach.     Discharge: Patient from home with expectation to return home upon discharge VS transfer to SNF and transition to AL (original plan prior to hospitalization). Family voiced desire to discharge to [facility].     Plan of care ongoing.       Goals per Patient Condition   Fall Prevention Plan: Patient will remain free from falls. See the Daily cares/safety flowsheet for intervention documentation.  Skin Integrity Plan: Patient skin integrity maintained. See Integumentary flowsheet for intervention documentation.  Isolation Precautions in Use: Isolation precautions in place. See Daily cares/safety flowsheet for intervention documentation.  DVT Prophylaxis in Use: Patient will remain free from DVT. See Daily cares/safety flowsheet for intervention documentation.  Pain Management Plan: Patient is being monitored for pain and response to treatment. See Vital Signs Complex Assessment flowsheet and MAR for intervention documentation.      Goals/Plan for shift  Patient/Family stated goal for shift: maintain comofrt  Nursing goal for shift: maintain safety, comfort, promote hydration, maintain temp below 100.4 as able  Plan: Monitor I&O. ENcourage PO  fluids. Monitor temp. administer meds as ordered, fall precautions    Goals/Plan for Hospital Stay  Patient/Family stated goal for hospital stay: feel better  Nursing goal for hospital stay: control infection and pain, safety  Plan administer meds as ordered    Electronically signed by Andrea M Wamprecht, RN at 11/09/2022  4:14 AM EST

## 2022-11-09 NOTE — Consults (Signed)
Associated Order(s): IP CONSULT TO PALLIATIVE CARE  Formatting of this note is different from the original.  Palliative Care Consult    Name: Janice Cunningham      Room/Bed: B0175/Z0258-N  DOB: 17-Sep-1926         88 y.o.    11/09/2022 10:02 AM  Palliative Care Consult requested by S. Pathmarajah    HCPOA/Family contact: Jearld Fenton (daughter) [phone]  Code Status:  FULL    Reason for consultation:     Goals of care    History Present Illness     Patient is a 88 year old female with a past medical history of dementia, ESBL and hypertension who presented to the ED with weakness.  Patient found to be COVID-positive.  Palliative care has been consulted for goals of care discussion.    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 cath urine     Past Surgical History:   Procedure Laterality Date    HYSTERECTOMY       Allergies   Allergen Reactions    Penicillins Rash     On 11/06/22 upon admission patient's daughter confirmed that pt has no allergies.     History reviewed. No pertinent family history.   Social History     Socioeconomic History    Marital status: Widowed     Spouse name: Not on file    Number of children: Not on file    Years of education: Not on file    Highest education level: Not on file   Occupational History    Not on file   Tobacco Use    Smoking status: Never    Smokeless tobacco: Never   Vaping Use    Vaping Use: Never used   Substance and Sexual Activity    Alcohol use: Not Currently    Drug use: Never    Sexual activity: Not Currently   Other Topics Concern    Not on file   Social History Narrative    Not on file     Social Determinants of Health     Financial Resource Strain: Not on file   Food Insecurity: Not on file   Transportation Needs: Not on file   Physical Activity: Not on file   Stress: Not on file   Social Connections: Not on file   Intimate Partner Violence: Not on file    Housing Stability: Not on file     Current Symtpoms     Current Symptoms:    (0-none, 1-mild, 2-moderate, 3-severe; if moderate to severe, describe below).    Depression:  0  Anorexia: 2  Inactivity: 1  Dyspnea:  0  Anxiety: 0  Nausea: 0  Drowsiness: 0  Constipation: 0  Agitation: 0  Physical Discomfort: 0  Fatigue: 2  Diarrhea: 0    Objective:      Vital Signs:BP: 145/70 (11/27 0938)  Temp: 98.4 F (36.9 C) (11/27 6144)  Pulse: 92 (11/27 0938)  Resp: 18 (11/27 0938)  SpO2: 92 % (11/27 0938)  FiO2 (%): --  O2 Flow Rate (L/min): --  Cardiac (WDL): --  Cardiac Rhythm: --  Last Filed Weights    11/06/22 2248   Weight: 112 lb 7 oz (51 kg)    Body mass index is 19.92 kg/m. Temp (48hrs), Avg:98.6 F (37 C), Min:97.8 F (36.6 C), Max:99.5 F (37.5 C)  Dementia: yes  Delirium: no  Coma: no  Fall Risk: yes    Physical Exam:      General Appearance Awake, confusion, no acute distress, frail appearance   HEENT Normocephalic, atraumatic, PERRL, conjuctiva pink/moist   Cardiovascular Regular rate, rhythm, S1S2 normal, no murmur or rub   Pulmonary Even expansion, no accessory muscle use.    Gastrointestinal BS x 4+, soft, non-tender, non-distended   EXT No cyanosis/clubbing/edema   SKIN Warm/dry/intact     Labs:   Recent Labs     11/06/22  1543 11/07/22  0602 11/08/22  0548 11/09/22  0358   WBC 5.9 7.1 7.2 8.6   HEMOGLOBIN 13.1 12.5 12.9 13.1   HCT 38.4 36.4 37.7 38.1   PLT 184 172 160 173   NA 139 139 137 136   K 3.9 3.6 3.4* 4.2   CL 103 104 103 105   CO2 27 25 24 21    BUN 22 24 23  26*   CREATININE 1.18 1.16 1.13 1.13   GLU 114* 107 96 120*   ALBUMIN 4.3 3.8  --   --    MG  --   --  1.8 2.1     MEDS:   Current Facility-Administered Medications:     acetaminophen (TYLENOL) tablet 500-1,000 mg, 500-1,000 mg, Oral, EVERY 8 HOURS PRN, 1,000 mg at 11/07/22 2318 **OR** acetaminophen (TYLENOL) tablet 650 mg, 650 mg, Oral, EVERY 6 HOURS PRN, Keri Nicole Hollencamp, APRN-CNP, 650 mg at 11/08/22 1142    [START ON 11/10/2022]  amLODIPine (NORVASC) tablet 10 mg, 10 mg, Oral, DAILY, Sivani 11/10/22, MD    ascorbic acid (vitamin C) (VITAMIN C) tablet 500 mg, 500 mg, Oral, DAILY, Sivani 11/12/2022, MD, 500 mg at 11/09/22 Kristeen Miss    benzonatate (TESSALON) capsule 200 mg, 200 mg, Oral, EVERY 8 HOURS PRN, Keri Nicole Hollencamp, APRN-CNP    chlorhexidine gluconate 2 % cloth, , Topical, DAILY AT 12 NOON, Keri Nicole Hollencamp, APRN-CNP, Given at 11/08/22 1200    cholecalciferol (VITAMIN D3) capsule 5,000 Units, 5,000 Units, Oral, DAILY, Sivani [id number], MD, 5,000 Units at 11/09/22 0939    docusate (COLACE) capsule 100 mg, 100 mg, Oral, TWO TIMES DAILY PRN, Keri Nicole Hollencamp, APRN-CNP    enoxaparin (LOVENOX) injection 30 mg, 30 mg, Subcutaneous, TWO TIMES DAILY, Keri Nicole Hollencamp, APRN-CNP, 30 mg at 11/09/22 0939    guaiFENesin (MUCINEX) extended-release tablet 600 mg, 600 mg, Oral, 2 times per day, Keri Nicole Hollencamp, APRN-CNP, 600 mg at 11/09/22 0939    ondansetron (ZOFRAN) injection 4 mg, 4 mg, Intravenous, EVERY 4 HOURS PRN **OR** ondansetron (ZOFRAN-ODT) disintegrating tablet 4 mg, 4 mg, Oral, EVERY 4 HOURS PRN, Keri Nicole Hollencamp, APRN-CNP    zinc sulfate (ZINCATE) capsule 220 mg, 220 mg, Oral, DAILY, Sivani 11/11/22, MD, 220 mg at 11/09/22 Kristeen Miss     MEDSPRN: acetaminophen **OR** acetaminophen, benzonatate, docusate, ondansetron **OR** ondansetron    MEDSINFUSIONS:         Imaging Studies: Reviewed in chart  Last Imaging results   Results for orders placed or performed during the hospital encounter of 11/06/22   CT-ANGIO HEAD&NECK W AND/OR WO CON W/POST IMG    Narrative    PROCEDURE:  1.  CT ANGIOGRAPHY HEAD WITH CONTRAST  2.  CT ANGIOGRAPHY NECK WITH CONTRAST    INDICATION: Head trauma, minor (Age >= 65y), syncope, fall    COMPARISON: CT head without contrast from the same day    TECHNIQUE: CT angiogram of the  head and neck was performed according to standard protocol. Axial images, multiplanar  reformatted images, and maximum intensity projection images were reviewed for CT angiographic technique. Up-to-date CT equipment and radiation dose reduction techniques were employed.    IV contrast: 65 mL Omnipaque 350    FINDINGS:    Non-angiographic findings:     There is biapical pleural thickening.    CTA Neck:    The visualized aortic arch appears normal. The configuration of the brachiocephalic vessels is typical. The innominate artery and both subclavian arteries appear normal. There is atherosclerotic disease at the bilateral carotid bifurcations extending into the origin of the internal carotid arteries without significant focal stenosis. Left external carotid artery demonstrates 50% stenosis at its origin. The cervical internal carotid arteries appear normal. The cervical vertebral arteries appear normal. Left vertebral artery is dominant.    CTA Head:    The distal internal carotid arteries appear normal. The anterior and middle cerebral arteries appear normal. Distal right vertebral artery terminates as the PICA. The basilar artery and posterior cerebral arteries appear normal. No aneurysms, vascular occlusions, or intracranial stenoses are identified.     Impression    1.  No aneurysms, vascular occlusions, or intracranial stenoses identified.  2.  No significant stenosis in the extracranial vertebral or internal carotid arteries. 50% stenosis of left external carotid artery origin.    Electronically Signed by: Gerald LeitzJohn B Tezel, MD, 11/07/2022 5:10 PM    XR-HIP BIL 3-4 VIEWS W/ PELVIS    Narrative    INDICATION: Traumatic injury. Pain.    PELVIS AND BILATERAL HIPS    3 views were performed. No comparison. Mineralization appears within normal limits. No evidence of acute fracture. No dislocation identified. Mild bilateral hip degenerative joint space narrowing is identified. No pathologic calcification identified. Soft tissues are unremarkable.         Impression    No acute osseous abnormality. Mild hip  joint space narrowing most consistent with degenerative osteoarthritis.        Electronically Signed by: Vallarie MareGlenn Taylor, MD, 11/07/2022 2:01 PM    XR-WRIST RIGHT 2 VIEWS    Narrative    INDICATION: Follow-up home. Pain in right wrist.    RIGHT WRIST    2 views were performed. No comparison. Mineralization appears within normal limits. No evidence of acute fracture. No dislocation identified. Mild radiocarpal joint space narrowing identified. Mild narrowing at first carpometacarpal articulation also noted. No pathologic calcification identified. Soft tissues are unremarkable.         Impression    No acute osseous abnormality. Mild degenerative joint disease.        Electronically Signed by: Vallarie MareGlenn Taylor, MD, 11/07/2022 2:02 PM    XR-WRIST LEFT 2 VIEWS    Narrative    INDICATION: Fall at home. Left wrist pain.    LEFT WRIST    2 views were performed. No comparison. Mineralization appears within normal limits. No evidence of acute fracture. No dislocation identified. Mild to moderate narrowing of radiocarpal space visualized. Moderate narrowing identified at first carpometacarpal articulation with marginal osteophyte formation noted. No pathologic calcification identified. Soft tissues are unremarkable.         Impression    No acute osseous abnormality. Mild to moderate degenerative joint disease.        Electronically Signed by: Vallarie MareGlenn Taylor, MD, 11/07/2022 2:04 PM    XR-FOREARM RIGHT 2 VIEWS    Narrative    INDICATION: Fall at home. Pain    RIGHT FOREARM  2 views were performed. No comparison. Mineralization appears within normal limits. No evidence of acute fracture. No dislocation identified. No significant degenerative joint space narrowing. No pathologic calcification identified. Soft tissues are unremarkable.         Impression    No acute osseous abnormality or significant degenerative joint disease.        Electronically Signed by: Vallarie Mare, MD, 11/07/2022 2:02 PM    XR-FOREARM LEFT 2 VIEWS    Narrative     INDICATION: Fall at home. Pain at left forearm.    LEFT FOREARM    2 views were performed. No comparison. Mineralization appears within normal limits. No evidence of acute fracture. No dislocation identified. No significant degenerative joint space narrowing. No pathologic calcification identified. Soft tissues are unremarkable.         Impression    No acute osseous abnormality or significant degenerative joint disease.        Electronically Signed by: Vallarie Mare, MD, 11/07/2022 2:03 PM        Spiritual assessment     Seen per chaplain no  Spiritual needs yes    Impression     This patient was seen in full proper PPE with a 95 mask, goggles, and gloves and with Foam in and foam out before and after the encounter and follow [facility] protocol    This is a 88 year old female with a past medical history of dementia, ESBL and hypertension who presented to the ED with weakness.  Patient is seen sitting up in the chair awake.  Patient appears confused.  Patient is nonverbal with Clinical research associate.    Patient is currently on room air with saturation 90%.  Vitals are stable.  Most recent BP 145/70, receiving an hypertensives.  Patient originally presented with weakness.  Patient found to be COVID-positive.  Patient is receiving vitamin D, C and zinc.  WBC count 8.6.  Pulmonology following.  Patient with poor intake.  Patient on regular diet.  Albumin 3.8.  Patient is weak, PT/OT services in place.  Patient appears comfortable at this time.    Patient does not have living will or healthcare power of attorney paperwork on file.  Per her daughters patient's daughter Debera Lat is her Museum/gallery exhibitions officer.  Extensive discussion with patient daughter Sudan related to goals of care and current medical health status, verbalized understanding.  Patient current FULL CODE STATUS.  Discussion regarding CPR and/or intubation if needed an emergent situation.  Writer strongly recommended DNR CCA, no intubation CODE  STATUS to patient's daughter, patient's daughter agreeable and verbalized understanding.  Paperwork completed.    Patient comes from private residence where she lives alone.  Recommend skilled services following hospital discharge.  Family voiced that they would like patient to discharge to [facility].  Recommend palliative services following hospital discharge, will follow up.  Writer to continue discussions related to goals of care.  LSW aware.    PPS 50%    Plan     1.  Changed to DNR CCA, no intubation CODE STATUS, confirmed with patient's daughter Sudan  2.  Agree with current plan of care  3.  Continue isolation precautions  4.  Continue PT/OT services  5.  Recommend skilled services following hospital discharge  6.  Recommend palliative services following hospital discharge, will follow up  7.  Consult pastoral services for supportive care  8.  Writer to continue discussions related to goals of care  9.  Palliative care services will continue  to follow    Thank you for allowing the palliative care team to participate in the care of this patient.    Greater than 50% of time during encounter was spent counseling and/or coordination of care as documented above.    Claudette Head, APRN-CNP      Electronically signed by Arlyss Repress, DO at 11/09/2022 12:55 PM EST

## 2022-11-09 NOTE — Progress Notes (Signed)
Formatting of this note might be different from the original.                                                Discharge Planning Brief Note    Plan A: referral to [facility] 11/26    Plan B:     Reason for Intervention: Spoke with Chasity, admissions at [facility]. They are working on obtaining an out of Fish farm manager for pt. Since pt is going to be a long-term resident in their [place], [place] states that this is generally not a problem to obtain. She states the precert is currently in process. She is aware that pt is medically ready for discharge.  Electronically signed by Barnetta Chapel, MSW, LISW-S at 11/09/2022 11:49 AM EST

## 2022-11-09 NOTE — Progress Notes (Signed)
Formatting of this note is different from the original.  [facility]    Nida Boatman, D.O., F.C.C.P.  Kallie Locks, M.D., Ph.D., F.C.C.P.  Marinda Elk, M.D., F.C.C.P.  Hollace Hayward, M.D., F.C.C.P.       Pulmonary Medicine Progress Note    Name:  Janice Cunningham Date/Time of Admission: 11/06/2022  8:32 PM    CSN: [id number] Attending Provider: Modesta Messing Pathmarajah,*   Room/Bed: U9323/F5732-K DOB: 1926-01-31 Age: 88 y.o.      SUBJECTIVE     Patient seen in follow up for COVID-19 viral infection, pulmonary congestion, recent fall, background dementia    Patient remains on room air.  No complaints overnight she received some IV fluids.  Creatinine 1.3 this morning  Remains afebrile, Tmax 99.5 F    WBC normal    LDH 192    Patient has received the following for COVID-19:    Decadron: n/a  Convalescent Plasma: n/a  Remdesivir: n/a  Baricitinib: n/a    COVID-19 Symptom Onset: Few days prior to admission.  She reports COVID 19 exposure  COVID-19 Positive Testing: 11/06/22    COVID-19 Vaccination: Yes, multiple (received last COVID booster in 2022)    Chest x-ray on admission without acute infiltrates.    Labs/chart/imaging reviewed.    OBJECTIVE     CURRENT MEDS:   amLODIPine  5 mg Oral DAILY    ascorbic acid (vitamin C)  500 mg Oral DAILY    chlorhexidine gluconate   Topical DAILY AT 12 NOON    cholecalciferol  5,000 Units Oral DAILY    enoxaparin (LOVENOX) injection (30 or 40 mg DVT Prophylaxis)  30 mg Subcutaneous TWO TIMES DAILY    guaiFENesin  600 mg Oral 2 times per day    zinc sulfate  220 mg Oral DAILY     INTAKE/OUTPUT:  I/O last 3 completed shifts:  In: 61 [P.O.:460]  Out: 450 [Urine:450]  No intake/output data recorded.     VITAL SIGNS:  Current Vital Signs  BP: 176/73 (11/26 2100)  Temp: 98.2 F (36.8 C) (11/26 2100)  Pulse: 91 (11/26 2100)  Resp: 18 (11/26 2100)  SpO2: 93 % (11/26 2100)  FiO2 (%): --  O2 Flow Rate (L/min): --  Cardiac (WDL):  --  Cardiac Rhythm: --       Vital Sign Range (last 24 hours)  Temp:  [98.1 F (36.7 C)-99.5 F (37.5 C)] 98.2 F (36.8 C)  Pulse:  [86-92] 91  Resp:  [18] 18  BP: (150-198)/(59-86) 176/73     PHYSICAL EXAM: EXAMINATION WAS PERFORMED IN FULL PPE; SOME PARTS WERE   DEFERRED TO LIMIT EXPOSURE AND LIMIT PPE USAGE    GENERAL: Well developed, Well nourished, No acute distress, Non-toxic appearance.  Elderly female with dementia, lying comfortably in bed, stares mostly, monosyllabilic speech  NECK: No JVD, trachea midline  CARDIOVASCULAR: deferred   CHEST: Symmetrical chest expansion, no accessory muscle use.  LUNGS: deferred  ABDOMEN: Soft, nontender, nondistended, normal bowel sounds  EXTREMITIES: No clubbing. No cyanosis. No edema of lower extremities  NEUROLOGIC: Awake, background dementia, Nonfocal exam grossly.    LABS:  Hematology  Recent Labs     11/07/22  0602 11/08/22  0548 11/09/22  0358   WBC 7.1 7.2 8.6   HEMOGLOBIN 12.5 12.9 13.1   HCT 36.4 37.7 38.1   PLT 172 160 173     No results for input(s): "PROTIME", "INR", "PTT" in the last 72 hours.  Chemistries  Recent Labs     11/07/22  0602 11/08/22  0548 11/09/22  0358   NA 139 137 136   K 3.6 3.4* 4.2   CL 104 103 105   CO2 25 24 21   BUN 24 23 26*   CREATININE 1.16 1.13 1.13   GLU 107 96 120*     Recent Labs     11/07/22  0602 11/08/22  0548 11/09/22  0358   CALCIUM 8.9 8.5* 8.5*   MG  --  1.8 2.1     LFTs  Recent Labs     11/06/22  1543 11/07/22  0602   AST 18 20   ALT 13 13     No results for input(s): "AMYLASE", "LIPASE" in the last 72 hours.    Arterial Blood Gasses  No results for input(s): "PH", "PCO2", "PO2", "O2SATURATION", "INSPIREDO2" in the last 72 hours.    Cardiac Enzymes  No results for input(s): "CREATPHOSKIN", "CKMBTOTAL", "CKMBISOSTOTA", "TROPONINI", "MYOGLOBIN", "BNP" in the last 72 hours.    Microbiology  Results for orders placed or performed during the hospital encounter of 11/06/22   Blood Culture    Specimen: Arm-Left; Blood    Result Value Ref Range    Blood Culture No Growth at 48 Hours      Imaging (chest imaging was personally reviewed)  CT-ANGIO HEAD&NECK W AND/OR WO CON W/POST IMG    Result Date: 11/07/2022  IMPRESSION: 1.  No aneurysms, vascular occlusions, or intracranial stenoses identified. 2.  No significant stenosis in the extracranial vertebral or internal carotid arteries. 50% stenosis of left external carotid artery origin. Electronically Signed by: John B Tezel, MD, 11/07/2022 5:10 PM     XR-WRIST LEFT 2 VIEWS    Result Date: 11/07/2022  IMPRESSION: No acute osseous abnormality. Mild to moderate degenerative joint disease.    Electronically Signed by: Glenn Taylor, MD, 11/07/2022 2:04 PM     XR-FOREARM LEFT 2 VIEWS    Result Date: 11/07/2022  IMPRESSION: No acute osseous abnormality or significant degenerative joint disease.    Electronically Signed by: Glenn Taylor, MD, 11/07/2022 2:03 PM     XR-WRIST RIGHT 2 VIEWS    Result Date: 11/07/2022  IMPRESSION: No acute osseous abnormality. Mild degenerative joint disease.    Electronically Signed by: Glenn Taylor, MD, 11/07/2022 2:02 PM     XR-FOREARM RIGHT 2 VIEWS    Result Date: 11/07/2022  IMPRESSION: No acute osseous abnormality or significant degenerative joint disease.    Electronically Signed by: Glenn Taylor, MD, 11/07/2022 2:02 PM     XR-HIP BIL 3-4 VIEWS W/ PELVIS    Result Date: 11/07/2022  IMPRESSION: No acute osseous abnormality. Mild hip joint space narrowing most consistent with degenerative osteoarthritis.    Electronically Signed by: Glenn Taylor, MD, 11/07/2022 2:01 PM     XR-CHEST PORTABLE STAT    Result Date: 11/06/2022  IMPRESSION: No acute pulmonary disease. Electronically Signed by: John B Tezel, MD, 11/06/2022 3:56 PM      ASSESSMENT   COVID-19 viral infection  Cough with mild pulmonary congestion, improved  Generalized weakness and malaise  Recent fall  Dementia  Dehydration, improved with fluids  Mild acute kidney injury versus chronic kidney disease  related to hypertension/age  Advanced age  Hypertension    PLAN   Continue supportive measures  Patient remains on room air.  Okay to use oxygen as needed to keep SPO2 greater than 89%.  No indication for Decadron, convalescent plasma, or Remdesivir.    She would have have been a good candidate for outpatient Paxlovid.  Aspiration precautions  Tylenol for fever.  Avoid ibuprofen  Self proning was encouraged  Cough suppressants as needed   Pulmonary toilet  Vitamin D vitamin C and zinc will be continued  Follow inflammatory markers based on symptoms  Nutritional rehabilitation encourage  Encourage p.o. hydration.   DVT/GI prophylaxis  No objection to discharge to rehab facility once bed available. Recommend COVID-19 booster dose 60-90 days after most recent infection  Pulmonary status stable/at baseline.  Our service will sign off.  Please call with questions, concerns, or a change in pulmonary status.  Thank-You.      Case discussed with: Patient, nursing and Dr Pathmarajah.    I personally reviewed chart, labs, and imaging, interviewed and examined the patient, formulated assessment and plan.    Thank you very much for the opportunity to participate in care of Janice Cunningham.    Please note that this chart was generated using dragon dictation software. Although every effort was made to ensure the accuracy of this automated transcription, some errors in transcription may have occurred.     Electronically Signed:  Oluwole O.A. Onadeko, MD FCCP 11/09/2022 8:33 AM   Electronically signed by Oluwole O.A. Onadeko, MD at 11/09/2022  1:58 PM EST

## 2022-11-09 NOTE — Progress Notes (Signed)
Formatting of this note might be different from the original.  Images from the original note were not included.        Inpatient Wound & Ostomy "Found Down" Chart Audit    BPA received on patient from Found Down List    Chart audit completed on patient at this time.  Patient admitted to [facility] for fall.  Patient noted to be not be found down. Patient's Braden Score is 16.    Prevention Measures: If Braden 18 or less    [x]   Skin assessment along with Braden Scale assessment every 8 hours & PRN                                           [x]   Turn every 2 hours & PRN            [x]   Elevate heels off bed with pillow  []   Bilateral heel lift boots while in bed   []   Clean with Remedy Clinical Cleanse No-Rinse Foam Cleanser               []   Apply Remedy Clinical Moisturize Skin Cream (purple top) to arms, legs & feet every shift & PRN                                              [x]   Apply Remedy Clinical Prevent Silicone D cream (blue top) to buttocks every shift & PRN        []   Apply Remedy Clinical Protect Zinc Oxide Paste Skin Protectant (orange top) to buttocks every shift & PRN                                          [x]   Limit use of briefs while in bed                                                   []   Apply ear saver protectors to oxygen tubing                                                 []   For Bipap use-bipap gel pad for protection on bridge of nose.  []   Apply Duoderm dots to bilateral cheeks under oxygen tubing for protection (Duoderm dots [id number]# [id number])  []   Consult Nutrition   []   Wound Care Consulted    Rosemarie Ax, BSN, RN, [facility]  Inpatient [place] Team     [place] Team Phone- [phone]  Phone Ext- [phone]  Electronically signed by Almyra Free, RN at 11/09/2022  7:50 AM EST

## 2022-11-09 NOTE — Progress Notes (Signed)
Formatting of this note is different from the original.   Physical Therapy Treatment    Admit date:  11/06/2022          Today's Date: 11/09/2022      Patient Name/MR#:  Janice Cunningham     [id number]   Current Room:  Y8657/Q4696-EF5540/F5540-A   Admitting Diagnosis: Weakness generalized [R53.1]  COVID-19 [U07.1]    Admitting Provider: Clarisse Gougeanjit Katneni, MD       PT Discharge Recommendations  1. Physical Therapy services recommended to continue after discharge: Yes  2. Supervision for safety required: Caregiver will need to provide full time supervision: Unobserved for brief periods of time only but not left alone  3. Physical Assistance required for daily care: Caregiver will need to provide total physical assistance: Caregiver will need to supply 76-100% of physical assistance  4. PT Discharge Equipment Needs if Returning Home: Other (Comment)      PT Discharge Equipment Comments: continue to assess    Functional Outcomes/Testing and Reporting    Basic Mobility: AM-PAC 6 Clicks:8    General  - Subjective: pt does not speak throughout entire session    - Current Medical Status: Janice Cunningham is a 88 y.o. female who presents to the Emergency Department for evaluation of lethargy and increased confusion.  Family reports that the patient ate very little yesterday and was not able to get up and move around and slept most of the day.  Pt positive for COVID. Pt was DC'd home and experienced fall. Returns to ED and admitted.    - Testing/Imaging: CT-ANGIO HEAD&NECK   IMPRESSION: 1.  No aneurysms, vascular occlusions, or intracranial stenoses identified. 2.  No significant stenosis in the extracranial vertebral or internal carotid arteries. 50% stenosis of left external carotid artery origin. XR-WRIST LEFT 2 VIEWS   IMPRESSION: No acute osseous abnormality. Mild to moderate degenerative joint disease. XR-FOREARM LEFT 2 VIEWS IMPRESSION: No acute osseous abnormality or significant degenerative joint disease.  XR-WRIST RIGHT 2 VIEWS   IMPRESSION: No acute osseous abnormality. Mild degenerative joint disease. XR-FOREARM RIGHT 2 VIEWS IMPRESSION: No acute osseous abnormality or significant degenerative joint disease.     XR-HIP BIL 3-4 VIEWS W/ PELVIS   IMPRESSION: No acute osseous abnormality. Mild hip joint space narrowing most consistent with degenerative osteoarthritis.    Home Environment  Source: Patient is unreliable historian (Pt with dementia at baseline and HOH, unable to get PLOF)      Home Equipment       Prior Functional Level  Functional Transfers: Other (comment) (unreliable historian. unable to provide subjective information.)  Functional Mobility: Other (comment) (unreliable historian. unable to provide subjective information.)    Premorbid Communication: Hearing impaired, Wears hearing aids  Premorbid Cognition: Impaired (per chart, pt with dementia and slight confusion at baseline.)    Weightbearing Status   (n/a)    Therapy Precautions  Fall Risk  Additional Comments:     Orthotics/Braces  Orthotics, Braces: Not applicable    Session Beginning Location, Safety Info  Upon Arrival Patient Location: In bed  Upon Arrival Safety: Bed alarm on  Pertinent Information Since Last Session: No acute changes since last session    Pain Assessment  Pain Score: Patient unable to rate but no noted signs of pain        Observations: no s/s of distress.pt HOH making command following difficult.     Task Current Assist Level Devices, Other Details   Bed Mobility      Rolling  Right Left      Supine to Sit Total assist    Sit to Supine      Sit to/from Stand Maximal assist (performs 2x from EOB and one time from chair.) On Evaluation    Subsequent Treatment   (2x to FWW. 1x no A.D.)   Transfer       Bed to Chair Other (comment) (attempted to perform SPT using FWW with Mod A. pt freezes part way through t/f - difficulty advancing LEs despite max tactile cues for wt shifting. therapist assisted pt back to EOB. continued to complete t/f w/out A..D.,  total A.)     Gait Training      On Evaluation      Subsequent Treatment    Distance:  On Evaluation: 4 feet   Subsequent Treatment:     Stair Training      On Evaluation:    Subsequent Treatment:       Supine To Sit    GOAL: Will perform supine to sit with: Minimal assist      Treatment This Date:     Supine to Sit Status: Addressed during today's session    Supine to Sit Assistance: Total assist    Assistance Required for: Bilateral LEs, Trunk, Physical assist for scooting hips to edge of bed    Therapeutic Modifications During Session: HOB elevated    Sit to Supine    GOAL: Will perform sit to supine with: Minimal assist      Treatment This Date:    Sit to Supine Status: Not Addressed during today?s session    Sit to/from Stand    GOAL: Will perform sit to/from stand with: Minimal assist, Least restrictive device      Treatment This Date:     Sit to/From Stand Status: Addressed during today's session    Sit to Stand Assistance: Maximal assist (performs 2x from EOB and one time from chair.)    Assistive Devices:  (2x to FWW. 1x no A.D.)    Assistance Required for: Clearing buttocks from surface    Therapeutic Modifications Required for: Bed height elevated    Bed to Chair    GOAL: Will perform sit to/from chair with: Minimal assist, Least restrictive assistive device      Treatment This Date:     Bed to Chair: Addressed during today's session    Bed to Chair Assistance: Other (comment) (attempted to perform SPT using FWW with Mod A. pt freezes part way through t/f - difficulty advancing LEs despite max tactile cues for wt shifting. therapist assisted pt back to EOB. continued to complete t/f w/out A..D., total A.)      Assistance Required for: Safety, Tactile cues, Controlled descent, Positioning/Alignment    Therapeutic Modifications Required for: Pivot     Gait Training    GOAL: Will ambulate: 100-150 feet, Minimal assist, Least restrictive assistive device      Treatment This Date:    Gait Training Status: Not  Addressed during today?s session    Balance    GOAL: Will participate in: Static, Dynamic, For 5 minutes, with minimal assist (LRAD)    Balance - Standing    Treatment This Date:    Standing Balance Status: Not Addressed during today?s session      Balance - Sitting    Treatment This Date:     Sitting Balance Status: Not Addressed during today?s session      Home Exercise    GOAL: Patient to complete HEP:  Seated, Standing, With least restrictive assistive device, With minimal verbal cues      Treatment This Date:    HEP Goal Status: Not Addressed During Today?s Session      POC DUE  11/22/22    Patient/Caregiver Education  Education Provided: Role of PT, Use of call light for assistance, Transfer training, Importance of mobility  Patient Education Completed: Requires continued education  Family/Caregiver Education Completed: No family/caregiver present    Session Ending Location, Safety Info  End of Session Patient Location: Sitting in chair  End of Session Patient Safety: Chair alarm on  Interdisciplinary Communication: Spoke with RN prior to session, RN updated on patient performance    Plan  PT Patient/Family Goal: unable to state goal at time of evaluation  Treatment: Therapeutic activities, Therapeutic exercise, Gait training, Neuromuscular re-education, Self-care  PT Frequency: 5x/wk  PT Duration of Treatment: 2 weeks  PT Co-Treat: Yes  Reason for Cotreatment: Poor activity tolerance for participation in multiple treatment sessions, Requires > mod assist for progressive mobility out of bed activities    Treatment Time/Minutes  Time Treatment Initiated: 0845  Time Treament Completed: 0908  Total Treatment Time: 23  Timed Code Minutes: 23 Minutes        Electronically signed by Loman Brooklyn, PT at 11/09/2022 12:24 PM EST

## 2022-11-09 NOTE — Progress Notes (Signed)
Formatting of this note might be different from the original.  I visited the patient. She was sleeping on several occasions. I told the nurse to call me when the patient was ready to receive the visit.   Electronically signed by Jetty Peeks at 11/09/2022  3:28 PM EST

## 2022-11-09 NOTE — Care Plan (Signed)
Formatting of this note might be different from the original.  End of Shift and Plan of Care Summary  Patient A&Ox2-3 w/intermittent confusion. Patient able to communicate needs. Family at bedside at [facility] Modified transmission based precautions in place. VSS, Respirations even and unlabored. Afebrile. No SOB. Non productive cough present occasionally. No S/S of acute distress noted.     LR x523mL completed. Right AC peripheral IV access line remains intact, patent, C/D.     No c/o pain t/o shift.     Patient incontinent of bladder. External catheter placed.    Fall Prevention: safety maintained, fall risk protocol in place. Bed in lowest position with wheels locked. Yellow socks and bed alarm on for safety.  Side rails up x2. Call light and bedside table within reach.     Discharge: Patient from home with expectation to return home upon discharge VS transfer to SNF and transition to AL (original plan prior to hospitalization). Family voiced desire to discharge to [facility].     Plan of care ongoing.       Goals per Patient Condition   Fall Prevention Plan: Patient will remain free from falls. See the Daily cares/safety flowsheet for intervention documentation.  Skin Integrity Plan: Patient skin integrity maintained. See Integumentary flowsheet for intervention documentation.  Isolation Precautions in Use: Isolation precautions in place. See Daily cares/safety flowsheet for intervention documentation.  DVT Prophylaxis in Use: Patient will remain free from DVT. See Daily cares/safety flowsheet for intervention documentation.  Pain Management Plan: Patient is being monitored for pain and response to treatment. See Vital Signs Complex Assessment flowsheet and MAR for intervention documentation.      Goals/Plan for shift  Patient/Family stated goal for shift: maintain comofrt  Nursing goal for shift: maintain safety, comfort, promote hydration, maintain temp below 100.4 as able  Plan: Monitor I&O. ENcourage PO  fluids. Monitor temp. administer meds as ordered, fall precautions    Goals/Plan for Hospital Stay  Patient/Family stated goal for hospital stay: feel better  Nursing goal for hospital stay: control infection and pain, safety  Plan administer meds as ordered    Electronically signed by Sydnee Cabal, RN at 11/09/2022  4:14 AM EST

## 2022-11-09 NOTE — Care Plan (Signed)
Formatting of this note might be different from the original.  End of Shift and Plan of Care Summary        Patient o2 sat above 92% on RA. Patient sat in chair for breakfast and lunch. Patient resting in bed with family at bedside.     Goals per Patient Condition   Fall Prevention Plan: Patient will remain free from falls. See the Daily cares/safety flowsheet for intervention documentation.  Skin Integrity Plan: Patient skin integrity maintained. See Integumentary flowsheet for intervention documentation.  Isolation Precautions in Use: Isolation precautions in place. See Daily cares/safety flowsheet for intervention documentation.  DVT Prophylaxis in Use: Patient will remain free from DVT. See Daily cares/safety flowsheet for intervention documentation.  Pain Management Plan: Patient is being monitored for pain and response to treatment. See Vital Signs Complex Assessment flowsheet and MAR for intervention documentation.      Goals/Plan for shift  Patient/Family stated goal for shift: uta  Nursing goal for shift: maintain safety, comfort, prevent falls  Plan: give prn meds, fall precautions    Goals/Plan for Hospital Stay  Patient/Family stated goal for hospital stay: feel better  Nursing goal for hospital stay: control infection and pain, safety  Plan administer meds as ordered    Electronically signed by Maurine Cane, RN at 11/09/2022  6:18 PM EST

## 2022-11-09 NOTE — Care Plan (Signed)
Formatting of this note might be different from the original.  End of Shift and Plan of Care Summary        Patient o2 sat above 92% on RA. Patient sat in chair for breakfast and lunch. Patient resting in bed with family at bedside.     Goals per Patient Condition   Fall Prevention Plan: Patient will remain free from falls. See the Daily cares/safety flowsheet for intervention documentation.  Skin Integrity Plan: Patient skin integrity maintained. See Integumentary flowsheet for intervention documentation.  Isolation Precautions in Use: Isolation precautions in place. See Daily cares/safety flowsheet for intervention documentation.  DVT Prophylaxis in Use: Patient will remain free from DVT. See Daily cares/safety flowsheet for intervention documentation.  Pain Management Plan: Patient is being monitored for pain and response to treatment. See Vital Signs Complex Assessment flowsheet and MAR for intervention documentation.      Goals/Plan for shift  Patient/Family stated goal for shift: uta  Nursing goal for shift: maintain safety, comfort, prevent falls  Plan: give prn meds, fall precautions    Goals/Plan for Hospital Stay  Patient/Family stated goal for hospital stay: feel better  Nursing goal for hospital stay: control infection and pain, safety  Plan administer meds as ordered    Electronically signed by Breanna Kay Burchett, RN at 11/09/2022  6:18 PM EST

## 2022-11-09 NOTE — Progress Notes (Signed)
Formatting of this note might be different from the original.  I visited the patient. She was sleeping on several occasions. I told the nurse to call me when the patient was ready to receive the visit.   Electronically signed by Carlos Gill at 11/09/2022  3:28 PM EST

## 2022-11-09 NOTE — Progress Notes (Signed)
Formatting of this note is different from the original.  [facility]    Nida Boatman, D.O., F.C.C.P.  Kallie Locks, M.D., Ph.D., F.C.C.P.  Marinda Elk, M.D., F.C.C.P.  Hollace Hayward, M.D., F.C.C.P.       Pulmonary Medicine Progress Note    Name:  Janice Cunningham Date/Time of Admission: 11/06/2022  8:32 PM    CSN: [id number] Attending Provider: Modesta Messing Pathmarajah,*   Room/Bed: U9323/F5732-K DOB: 1926-01-31 Age: 88 y.o.      SUBJECTIVE     Patient seen in follow up for COVID-19 viral infection, pulmonary congestion, recent fall, background dementia    Patient remains on room air.  No complaints overnight she received some IV fluids.  Creatinine 1.3 this morning  Remains afebrile, Tmax 99.5 F    WBC normal    LDH 192    Patient has received the following for COVID-19:    Decadron: n/a  Convalescent Plasma: n/a  Remdesivir: n/a  Baricitinib: n/a    COVID-19 Symptom Onset: Few days prior to admission.  She reports COVID 19 exposure  COVID-19 Positive Testing: 11/06/22    COVID-19 Vaccination: Yes, multiple (received last COVID booster in 2022)    Chest x-ray on admission without acute infiltrates.    Labs/chart/imaging reviewed.    OBJECTIVE     CURRENT MEDS:   amLODIPine  5 mg Oral DAILY    ascorbic acid (vitamin C)  500 mg Oral DAILY    chlorhexidine gluconate   Topical DAILY AT 12 NOON    cholecalciferol  5,000 Units Oral DAILY    enoxaparin (LOVENOX) injection (30 or 40 mg DVT Prophylaxis)  30 mg Subcutaneous TWO TIMES DAILY    guaiFENesin  600 mg Oral 2 times per day    zinc sulfate  220 mg Oral DAILY     INTAKE/OUTPUT:  I/O last 3 completed shifts:  In: 61 [P.O.:460]  Out: 450 [Urine:450]  No intake/output data recorded.     VITAL SIGNS:  Current Vital Signs  BP: 176/73 (11/26 2100)  Temp: 98.2 F (36.8 C) (11/26 2100)  Pulse: 91 (11/26 2100)  Resp: 18 (11/26 2100)  SpO2: 93 % (11/26 2100)  FiO2 (%): --  O2 Flow Rate (L/min): --  Cardiac (WDL):  --  Cardiac Rhythm: --       Vital Sign Range (last 24 hours)  Temp:  [98.1 F (36.7 C)-99.5 F (37.5 C)] 98.2 F (36.8 C)  Pulse:  [86-92] 91  Resp:  [18] 18  BP: (150-198)/(59-86) 176/73     PHYSICAL EXAM: EXAMINATION WAS PERFORMED IN FULL PPE; SOME PARTS WERE   DEFERRED TO LIMIT EXPOSURE AND LIMIT PPE USAGE    GENERAL: Well developed, Well nourished, No acute distress, Non-toxic appearance.  Elderly female with dementia, lying comfortably in bed, stares mostly, monosyllabilic speech  NECK: No JVD, trachea midline  CARDIOVASCULAR: deferred   CHEST: Symmetrical chest expansion, no accessory muscle use.  LUNGS: deferred  ABDOMEN: Soft, nontender, nondistended, normal bowel sounds  EXTREMITIES: No clubbing. No cyanosis. No edema of lower extremities  NEUROLOGIC: Awake, background dementia, Nonfocal exam grossly.    LABS:  Hematology  Recent Labs     11/07/22  0602 11/08/22  0548 11/09/22  0358   WBC 7.1 7.2 8.6   HEMOGLOBIN 12.5 12.9 13.1   HCT 36.4 37.7 38.1   PLT 172 160 173     No results for input(s): "PROTIME", "INR", "PTT" in the last 72 hours.  Chemistries  Recent Labs     11/07/22  0602 11/08/22  0548 11/09/22  0358   NA 139 137 136   K 3.6 3.4* 4.2   CL 104 103 105   CO2 25 24 21    BUN 24 23 26*   CREATININE 1.16 1.13 1.13   GLU 107 96 120*     Recent Labs     11/07/22  0602 11/08/22  0548 11/09/22  0358   CALCIUM 8.9 8.5* 8.5*   MG  --  1.8 2.1     LFTs  Recent Labs     11/06/22  1543 11/07/22  0602   AST 18 20   ALT 13 13     No results for input(s): "AMYLASE", "LIPASE" in the last 72 hours.    Arterial Blood Gasses  No results for input(s): "PH", "PCO2", "PO2", "O2SATURATION", "INSPIREDO2" in the last 72 hours.    Cardiac Enzymes  No results for input(s): "CREATPHOSKIN", "CKMBTOTAL", "CKMBISOSTOTA", "TROPONINI", "MYOGLOBIN", "BNP" in the last 72 hours.    Microbiology  Results for orders placed or performed during the hospital encounter of 11/06/22   Blood Culture    Specimen: Arm-Left; Blood    Result Value Ref Range    Blood Culture No Growth at 48 Hours      Imaging (chest imaging was personally reviewed)  CT-ANGIO HEAD&NECK W AND/OR WO CON W/POST IMG    Result Date: 11/07/2022  IMPRESSION: 1.  No aneurysms, vascular occlusions, or intracranial stenoses identified. 2.  No significant stenosis in the extracranial vertebral or internal carotid arteries. 50% stenosis of left external carotid artery origin. Electronically Signed by: 11/09/2022, MD, 11/07/2022 5:10 PM     XR-WRIST LEFT 2 VIEWS    Result Date: 11/07/2022  IMPRESSION: No acute osseous abnormality. Mild to moderate degenerative joint disease.    Electronically Signed by: 11/09/2022, MD, 11/07/2022 2:04 PM     XR-FOREARM LEFT 2 VIEWS    Result Date: 11/07/2022  IMPRESSION: No acute osseous abnormality or significant degenerative joint disease.    Electronically Signed by: 11/09/2022, MD, 11/07/2022 2:03 PM     XR-WRIST RIGHT 2 VIEWS    Result Date: 11/07/2022  IMPRESSION: No acute osseous abnormality. Mild degenerative joint disease.    Electronically Signed by: 11/09/2022, MD, 11/07/2022 2:02 PM     XR-FOREARM RIGHT 2 VIEWS    Result Date: 11/07/2022  IMPRESSION: No acute osseous abnormality or significant degenerative joint disease.    Electronically Signed by: 11/09/2022, MD, 11/07/2022 2:02 PM     XR-HIP BIL 3-4 VIEWS W/ PELVIS    Result Date: 11/07/2022  IMPRESSION: No acute osseous abnormality. Mild hip joint space narrowing most consistent with degenerative osteoarthritis.    Electronically Signed by: 11/09/2022, MD, 11/07/2022 2:01 PM     XR-CHEST PORTABLE STAT    Result Date: 11/06/2022  IMPRESSION: No acute pulmonary disease. Electronically Signed by: 11/08/2022, MD, 11/06/2022 3:56 PM      ASSESSMENT   COVID-19 viral infection  Cough with mild pulmonary congestion, improved  Generalized weakness and malaise  Recent fall  Dementia  Dehydration, improved with fluids  Mild acute kidney injury versus chronic kidney disease  related to hypertension/age  Advanced age  Hypertension    PLAN   Continue supportive measures  Patient remains on room air.  Okay to use oxygen as needed to keep SPO2 greater than 89%.  No indication for Decadron, convalescent plasma, or Remdesivir.  She would have have been a good candidate for outpatient Paxlovid.  Aspiration precautions  Tylenol for fever.  Avoid ibuprofen  Self proning was encouraged  Cough suppressants as needed   Pulmonary toilet  Vitamin D vitamin C and zinc will be continued  Follow inflammatory markers based on symptoms  Nutritional rehabilitation encourage  Encourage p.o. hydration.   DVT/GI prophylaxis  No objection to discharge to rehab facility once bed available. Recommend COVID-19 booster dose 60-90 days after most recent infection  Pulmonary status stable/at baseline.  Our service will sign off.  Please call with questions, concerns, or a change in pulmonary status.  Thank-You.      Case discussed with: Patient, nursing and Dr Dory Peru.    I personally reviewed chart, labs, and imaging, interviewed and examined the patient, formulated assessment and plan.    Thank you very much for the opportunity to participate in care of Anais M Gronau.    Please note that this chart was generated using dragon dictation software. Although every effort was made to ensure the accuracy of this automated transcription, some errors in transcription may have occurred.     Electronically Signed:  Oluwole O.A. Mila Palmer, MD [facility] 11/09/2022 8:33 AM   Electronically signed by Milus Glazier. Mila Palmer, MD at 11/09/2022  1:58 PM EST

## 2022-11-09 NOTE — Progress Notes (Signed)
Formatting of this note is different from the original.   Physical Therapy Treatment    Admit date:  11/06/2022          Today's Date: 11/09/2022      Patient Name/MR#:  Janice Cunningham     [id number]   Current Room:  F5540/F5540-A   Admitting Diagnosis: Weakness generalized [R53.1]  COVID-19 [U07.1]    Admitting Provider: Ranjit Katneni, MD       PT Discharge Recommendations  1. Physical Therapy services recommended to continue after discharge: Yes  2. Supervision for safety required: Caregiver will need to provide full time supervision: Unobserved for brief periods of time only but not left alone  3. Physical Assistance required for daily care: Caregiver will need to provide total physical assistance: Caregiver will need to supply 76-100% of physical assistance  4. PT Discharge Equipment Needs if Returning Home: Other (Comment)      PT Discharge Equipment Comments: continue to assess    Functional Outcomes/Testing and Reporting    Basic Mobility: AM-PAC 6 Clicks:8    General  - Subjective: pt does not speak throughout entire session    - Current Medical Status: Janice Cunningham is a 88 y.o. female who presents to the Emergency Department for evaluation of lethargy and increased confusion.  Family reports that the patient ate very little yesterday and was not able to get up and move around and slept most of the day.  Pt positive for COVID. Pt was DC'd home and experienced fall. Returns to ED and admitted.    - Testing/Imaging: CT-ANGIO HEAD&NECK   IMPRESSION: 1.  No aneurysms, vascular occlusions, or intracranial stenoses identified. 2.  No significant stenosis in the extracranial vertebral or internal carotid arteries. 50% stenosis of left external carotid artery origin. XR-WRIST LEFT 2 VIEWS   IMPRESSION: No acute osseous abnormality. Mild to moderate degenerative joint disease. XR-FOREARM LEFT 2 VIEWS IMPRESSION: No acute osseous abnormality or significant degenerative joint disease.  XR-WRIST RIGHT 2 VIEWS   IMPRESSION: No acute osseous abnormality. Mild degenerative joint disease. XR-FOREARM RIGHT 2 VIEWS IMPRESSION: No acute osseous abnormality or significant degenerative joint disease.     XR-HIP BIL 3-4 VIEWS W/ PELVIS   IMPRESSION: No acute osseous abnormality. Mild hip joint space narrowing most consistent with degenerative osteoarthritis.    Home Environment  Source: Patient is unreliable historian (Pt with dementia at baseline and HOH, unable to get PLOF)      Home Equipment       Prior Functional Level  Functional Transfers: Other (comment) (unreliable historian. unable to provide subjective information.)  Functional Mobility: Other (comment) (unreliable historian. unable to provide subjective information.)    Premorbid Communication: Hearing impaired, Wears hearing aids  Premorbid Cognition: Impaired (per chart, pt with dementia and slight confusion at baseline.)    Weightbearing Status   (n/a)    Therapy Precautions  Fall Risk  Additional Comments:     Orthotics/Braces  Orthotics, Braces: Not applicable    Session Beginning Location, Safety Info  Upon Arrival Patient Location: In bed  Upon Arrival Safety: Bed alarm on  Pertinent Information Since Last Session: No acute changes since last session    Pain Assessment  Pain Score: Patient unable to rate but no noted signs of pain        Observations: no s/s of distress.pt HOH making command following difficult.     Task Current Assist Level Devices, Other Details   Bed Mobility      Rolling   Right Left      Supine to Sit Total assist    Sit to Supine      Sit to/from Stand Maximal assist (performs 2x from EOB and one time from chair.) On Evaluation    Subsequent Treatment   (2x to FWW. 1x no A.D.)   Transfer       Bed to Chair Other (comment) (attempted to perform SPT using FWW with Mod A. pt freezes part way through t/f - difficulty advancing LEs despite max tactile cues for wt shifting. therapist assisted pt back to EOB. continued to complete t/f w/out A..D.,  total A.)     Gait Training      On Evaluation      Subsequent Treatment    Distance:  On Evaluation: 4 feet   Subsequent Treatment:     Stair Training      On Evaluation:    Subsequent Treatment:       Supine To Sit    GOAL: Will perform supine to sit with: Minimal assist      Treatment This Date:     Supine to Sit Status: Addressed during today's session    Supine to Sit Assistance: Total assist    Assistance Required for: Bilateral LEs, Trunk, Physical assist for scooting hips to edge of bed    Therapeutic Modifications During Session: HOB elevated    Sit to Supine    GOAL: Will perform sit to supine with: Minimal assist      Treatment This Date:    Sit to Supine Status: Not Addressed during today?s session    Sit to/from Stand    GOAL: Will perform sit to/from stand with: Minimal assist, Least restrictive device      Treatment This Date:     Sit to/From Stand Status: Addressed during today's session    Sit to Stand Assistance: Maximal assist (performs 2x from EOB and one time from chair.)    Assistive Devices:  (2x to FWW. 1x no A.D.)    Assistance Required for: Clearing buttocks from surface    Therapeutic Modifications Required for: Bed height elevated    Bed to Chair    GOAL: Will perform sit to/from chair with: Minimal assist, Least restrictive assistive device      Treatment This Date:     Bed to Chair: Addressed during today's session    Bed to Chair Assistance: Other (comment) (attempted to perform SPT using FWW with Mod A. pt freezes part way through t/f - difficulty advancing LEs despite max tactile cues for wt shifting. therapist assisted pt back to EOB. continued to complete t/f w/out A..D., total A.)      Assistance Required for: Safety, Tactile cues, Controlled descent, Positioning/Alignment    Therapeutic Modifications Required for: Pivot     Gait Training    GOAL: Will ambulate: 100-150 feet, Minimal assist, Least restrictive assistive device      Treatment This Date:    Gait Training Status: Not  Addressed during today?s session    Balance    GOAL: Will participate in: Static, Dynamic, For 5 minutes, with minimal assist (LRAD)    Balance - Standing    Treatment This Date:    Standing Balance Status: Not Addressed during today?s session      Balance - Sitting    Treatment This Date:     Sitting Balance Status: Not Addressed during today?s session      Home Exercise    GOAL: Patient to complete HEP:  Seated, Standing, With least restrictive assistive device, With minimal verbal cues      Treatment This Date:    HEP Goal Status: Not Addressed During Today?s Session      POC DUE  11/22/22    Patient/Caregiver Education  Education Provided: Role of PT, Use of call light for assistance, Transfer training, Importance of mobility  Patient Education Completed: Requires continued education  Family/Caregiver Education Completed: No family/caregiver present    Session Ending Location, Safety Info  End of Session Patient Location: Sitting in chair  End of Session Patient Safety: Chair alarm on  Interdisciplinary Communication: Spoke with RN prior to session, RN updated on patient performance    Plan  PT Patient/Family Goal: unable to state goal at time of evaluation  Treatment: Therapeutic activities, Therapeutic exercise, Gait training, Neuromuscular re-education, Self-care  PT Frequency: 5x/wk  PT Duration of Treatment: 2 weeks  PT Co-Treat: Yes  Reason for Cotreatment: Poor activity tolerance for participation in multiple treatment sessions, Requires > mod assist for progressive mobility out of bed activities    Treatment Time/Minutes  Time Treatment Initiated: 0845  Time Treament Completed: 0908  Total Treatment Time: 23  Timed Code Minutes: 23 Minutes        Electronically signed by Loman Brooklyn, PT at 11/09/2022 12:24 PM EST

## 2022-11-10 NOTE — Progress Notes (Signed)
Formatting of this note is different from the original.  Palliative Care Progress Note    Name: Janice Cunningham      Room/Bed: U1324/M0102-V  DOB: July 12, 1926         88 y.o.    11/10/2022   HCPOA/Family contact: [name] (daughter) [phone]   Code Status: DNR CCA    Impression/Plan     Patient was not physically assessed due to being in isolation for COVID-19.  Writer did collaborate with staff and thoroughly reviewed patient's chart.    This is a 88 year old female with a past medical history of dementia, ESBL and hypertension who presented to the ED with weakness.  Patient is seen through the window lying in her bed.  Per staff patient remains confused.    Patient is currently on room air with saturation 93%.  Staff reports patient is congested today.  Vitals are stable.  Most recent BP 153/59, receiving an hypertensives. Patient originally presented with weakness.  Patient found to be COVID-positive.  Patient is receiving vitamin D, C and zinc.  WBC count 8.5.  Pulmonology following.  Patient with poor intake.  Patient on regular diet.  Albumin 3.8.  Patient is weak, PT/OT services in place.  Patient appears comfortable at this time.    Patient does not have living will or healthcare power of attorney paperwork on file.  Per her daughters patient's daughter [name] is her Education officer, community.  Discussion yesterday with patient daughter [place] related to goals of care and current medical health status, verbalized understanding.  Patient current FULL CODE STATUS.  Discussion regarding CPR and/or intubation if needed an emergent situation.  Writer strongly recommended DNR CCA, no intubation CODE STATUS to patient's daughter, patient's daughter agreeable and verbalized understanding.  Paperwork completed.    Patient comes from private residence where she lives alone. Recommend skilled services following hospital discharge.  Family voiced that they would like patient to discharge to [facility].   Recommend palliative services following hospital discharge, will follow up.  Writer to continue discussions related to goals of care.  LSW aware.    1.  Changed to DNR CCA, no intubation CODE STATUS, confirmed with patient's daughter [place]  2.  Agree with current plan of care  3.  Continue isolation precautions  4.  Continue PT/OT services  5.  Recommend skilled services following hospital discharge  6.  Recommend palliative services following hospital discharge, will follow up  7.  Continue pastoral services for supportive care  8.  Writer to continue discussions related to goals of care  9.  Palliative care services will continue to follow    Objective:      Vital Signs:BP: 153/59 (11/28 0948)  Temp: 99 F (37.2 C) (11/28 0948)  Pulse: 84 (11/28 0948)  Resp: 18 (11/28 0948)  SpO2: 93 % (11/28 0948)  FiO2 (%): --  O2 Flow Rate (L/min): --  Cardiac (WDL): --  Cardiac Rhythm: --  Last Filed Weights    11/06/22 2248   Weight: 112 lb 7 oz (51 kg)    Body mass index is 19.92 kg/m. Temp (48hrs), Avg:98.8 F (37.1 C), Min:98.2 F (36.8 C), Max:99.5 F (37.5 C)      Dementia: yes  Delirium: no  Coma: no  Fall Risk: yes      PPS: 40%    Labs:   Recent Labs     11/08/22  0548 11/09/22  0358 11/10/22  0647   WBC 7.2 8.6 8.5  HEMOGLOBIN 12.9 13.1 13.5   HCT 37.7 38.1 39.6   PLT 160 173 194   NA 137 136 139   K 3.4* 4.2 3.9   CL 103 105 106   CO2 24 21 22    BUN 23 26* 36*   CREATININE 1.13 1.13 1.13   GLU 96 120* 97   MG 1.8 2.1 2.0     MEDS:   Current Facility-Administered Medications:     acetaminophen (TYLENOL) tablet 500-1,000 mg, 500-1,000 mg, Oral, EVERY 8 HOURS PRN, 1,000 mg at 11/07/22 2318 **OR** acetaminophen (TYLENOL) tablet 650 mg, 650 mg, Oral, EVERY 6 HOURS PRN, Keri Nicole Hollencamp, APRN-CNP, 650 mg at 11/10/22 0950    amLODIPine (NORVASC) tablet 10 mg, 10 mg, Oral, DAILY, Sivani 11/12/22, MD, 10 mg at 11/10/22 0950    ascorbic acid (vitamin C) (VITAMIN C) tablet 500 mg, 500 mg, Oral, DAILY,  Sivani 11/12/22, MD, 500 mg at 11/10/22 0950    benzonatate (TESSALON) capsule 200 mg, 200 mg, Oral, EVERY 8 HOURS PRN, Keri Nicole Hollencamp, APRN-CNP    chlorhexidine gluconate 2 % cloth, , Topical, DAILY AT 12 NOON, Keri Nicole Aurora, APRN-CNP, Given at 11/09/22 1211    cholecalciferol (VITAMIN D3) capsule 5,000 Units, 5,000 Units, Oral, DAILY, Sivani 1212, MD, 5,000 Units at 11/10/22 0950    docusate (COLACE) capsule 100 mg, 100 mg, Oral, TWO TIMES DAILY PRN, Keri Nicole Hollencamp, APRN-CNP    enoxaparin (LOVENOX) injection 30 mg, 30 mg, Subcutaneous, TWO TIMES DAILY, Keri Nicole Hollencamp, APRN-CNP, 30 mg at 11/10/22 0950    guaiFENesin (MUCINEX) extended-release tablet 600 mg, 600 mg, Oral, 2 times per day, Keri Nicole Hollencamp, APRN-CNP, 600 mg at 11/10/22 0950    ondansetron (ZOFRAN) injection 4 mg, 4 mg, Intravenous, EVERY 4 HOURS PRN **OR** ondansetron (ZOFRAN-ODT) disintegrating tablet 4 mg, 4 mg, Oral, EVERY 4 HOURS PRN, Keri Nicole Hollencamp, APRN-CNP    zinc sulfate (ZINCATE) capsule 220 mg, 220 mg, Oral, DAILY, Sivani 11/12/22, MD, 220 mg at 11/10/22 0950     MEDSPRN: acetaminophen **OR** acetaminophen, benzonatate, docusate, ondansetron **OR** ondansetron    MEDSINFUSIONS:         Imaging Studies: Reviewed in chart    Spiritual assessment     Seen per chaplain yes  Spiritual needs yes    Greater than 50% of time during encounter was spent counseling and/or coordination of care as documented above.    Electronically Signed:  11/12/22, APRN-CNP 11/10/2022      Electronically signed by 11/12/2022, DO at 11/11/2022  4:35 PM EST

## 2022-11-10 NOTE — Care Plan (Signed)
Formatting of this note might be different from the original.  End of Shift and Plan of Care Summary  A&1 w/intermittent confusion. Ability to assess orientation is impacted by the patient's severe hearing impairment, this impacts the patient's ability to follow commands at times. Hearing aides at bedside and in use w/staff encounters and as needed this shift. Staff speak in patient's left ear for best communication results.     Modified transmission based precautions in place. VSS, Respirations even and unlabored. Low grade temp managed w/PRN tylenol; effective. No SOB. Moist Non productive cough present occasionally. No S/S of acute distress noted.     Patient asleep w/ most of shift, would wake up w/care delivery encounters and promptly return to sleep once care had concluded.     Peripheral IV in place, patent, dressing C/D/I.     No c/o pain t/o shift.     Patient incontinent of bladder. External catheter placed.    Fall Prevention: safety maintained, fall risk protocol in place. Bed in lowest position with wheels locked. Yellow socks and bed alarm on for safety. Side rails up x2. Call light and bedside table within reach.     Discharge: Patient from home with expectation to transfer to SNF and transition to AL at [facility]. Pre-cert for out of network insurance pending per SW notes.     Plan of care ongoing.       Goals per Patient Condition   Fall Prevention Plan: Patient will remain free from falls. See the Daily cares/safety flowsheet for intervention documentation.  Skin Integrity Plan: Patient skin integrity maintained. See Integumentary flowsheet for intervention documentation.  Isolation Precautions in Use: Isolation precautions in place. See Daily cares/safety flowsheet for intervention documentation.  DVT Prophylaxis in Use: Patient will remain free from DVT. See Daily cares/safety flowsheet for intervention documentation.  Pain Management Plan: Patient is being monitored for pain and response  to treatment. See Vital Signs Complex Assessment flowsheet and MAR for intervention documentation.      Goals/Plan for shift  Patient/Family stated goal for shift: maintain comofort  Nursing goal for shift: maintain safety, comfort, promote hydration, maintain temp below 100.4 as able  Plan: Monitor I&O. ENcourage PO fluids. Monitor temp. administer meds as ordered, fall precautions    Goals/Plan for Hospital Stay  Patient/Family stated goal for hospital stay: feel better  Nursing goal for hospital stay: control infection and pain, safety  Plan administer meds as ordered    Electronically signed by Andrea M Wamprecht, RN at 11/10/2022  5:14 AM EST

## 2022-11-10 NOTE — Care Plan (Signed)
Formatting of this note might be different from the original.  End of Shift and Plan of Care Summary  A&1 w/intermittent confusion. Ability to assess orientation is impacted by the patient's severe hearing impairment, this impacts the patient's ability to follow commands at times. Hearing aides at bedside and in use w/staff encounters and as needed this shift. Staff speak in patient's left ear for best communication results.     Modified transmission based precautions in place. VSS, Respirations even and unlabored. Low grade temp managed w/PRN tylenol; effective. No SOB. Moist Non productive cough present occasionally. No S/S of acute distress noted.     Patient asleep w/ most of shift, would wake up w/care delivery encounters and promptly return to sleep once care had concluded.     Peripheral IV in place, patent, dressing C/D/I.     No c/o pain t/o shift.     Patient incontinent of bladder. External catheter placed.    Fall Prevention: safety maintained, fall risk protocol in place. Bed in lowest position with wheels locked. Yellow socks and bed alarm on for safety. Side rails up x2. Call light and bedside table within reach.     Discharge: Patient from home with expectation to transfer to SNF and transition to AL at [facility]. Pre-cert for out of network insurance pending per SW notes.     Plan of care ongoing.       Goals per Patient Condition   Fall Prevention Plan: Patient will remain free from falls. See the Daily cares/safety flowsheet for intervention documentation.  Skin Integrity Plan: Patient skin integrity maintained. See Integumentary flowsheet for intervention documentation.  Isolation Precautions in Use: Isolation precautions in place. See Daily cares/safety flowsheet for intervention documentation.  DVT Prophylaxis in Use: Patient will remain free from DVT. See Daily cares/safety flowsheet for intervention documentation.  Pain Management Plan: Patient is being monitored for pain and response  to treatment. See Vital Signs Complex Assessment flowsheet and MAR for intervention documentation.      Goals/Plan for shift  Patient/Family stated goal for shift: maintain comofort  Nursing goal for shift: maintain safety, comfort, promote hydration, maintain temp below 100.4 as able  Plan: Monitor I&O. ENcourage PO fluids. Monitor temp. administer meds as ordered, fall precautions    Goals/Plan for Hospital Stay  Patient/Family stated goal for hospital stay: feel better  Nursing goal for hospital stay: control infection and pain, safety  Plan administer meds as ordered    Electronically signed by Sydnee Cabal, RN at 11/10/2022  5:14 AM EST

## 2022-11-10 NOTE — Progress Notes (Signed)
Formatting of this note is different from the original.   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  has a past surgical history that includes Hysterectomy.      OT Discharge Recommendations  1. Occupational Therapy services recommended to continue after discharge: Yes  2. Supervision for safety required: Full time supervision required: Unobserved for brief periods of time only but not left alone  3. Physical assistance required for daily care: Maximum Physical Assistance required: Caregiver supplies 51-75% of physical assistance  4. OT Discharge Equipment Needs If Returning Home: Other (Comment) (Defer to next level of care)      Functional Outcomes/Testing and Reporting   Daily Activity AM-PAC 6 Click:  10    General  - Subjective: Pt states, "This is more like it" after therapy scooted pt up towards [facility] and positioned    - Current Medical Status: Janice Cunningham is a 88 y.o. female who presents to the Emergency Department for evaluation of lethargy and increased confusion.  Family reports that the patient ate very little yesterday and was not able to get up and move around and slept most of the day.  Pt positive for COVID. Pt was DC'd home and experienced fall. Returns to ED and admitted.    - Testing/Imaging: CT-ANGIO HEAD&NECK   IMPRESSION: 1.  No aneurysms, vascular occlusions, or intracranial stenoses identified. 2.  No significant stenosis in the extracranial vertebral or internal carotid arteries. 50% stenosis of left external carotid artery origin. XR-WRIST LEFT 2  VIEWS   IMPRESSION: No acute osseous abnormality. Mild to moderate degenerative joint disease. XR-FOREARM LEFT 2 VIEWS IMPRESSION: No acute osseous abnormality or significant degenerative joint disease.  XR-WRIST RIGHT 2 VIEWS  IMPRESSION: No acute osseous abnormality. Mild degenerative joint disease. XR-FOREARM RIGHT 2 VIEWS IMPRESSION: No acute osseous abnormality or significant degenerative joint disease.     XR-HIP BIL 3-4 VIEWS W/ PELVIS   IMPRESSION: No acute osseous abnormality. Mild hip joint space narrowing most consistent with degenerative osteoarthritis.    Home Environment  Source: Patient is unreliable historian (Pt with dementia at baseline and HOH, unable to get PLOF)      Home Equipment       Prior Functional Level  Patient is unreliable historian (Pt with dementia at baseline and HOH, unable to get PLOF)                   Premorbid Communication: Wears hearing aids, Hearing impaired, Wears glasses/contacts  Premorbid Cognition: Impaired    Weightbearing Status  Acute Weight Bearing Status: Full Weight Bearing (No restrictions ordered)    Therapy Precautions  Fall Risk  Additional Comments:     Orthotics/Braces      Session Beginning Location, Safety Info  Upon Arrival Patient Location: In bed   Upon Arrival Safety: Bed alarm on, Vitals stable, Other(comment) (Bed alarm going off upon therapy arrival)  Pertinent Information Since Last Session: No acute changes since last session     Pain Assessment  Pain Score:  Patient unable to rate but signs of pain noted  Pain Location: N/A  Pain Intervention: Patient reports pain tolerable level for therapy    Oxygen Administration Details  Oxygen (L/m): 0  Oxygen Delivery Device: RA    Pertinent Medication/Information  No s/s of distress. Once positioned in bed, pt starting to close eys, becoming drowsy. Pt HOH.     Task Current Assist Level Devices, Other Details   Grooming    Minimal assist    UB Dressing        LB Dressing        Bathing        Toileting     Total assist    Bed Mobility    Maximal assist    Sit to/from Stand        Toilet Transfer              Goals  OT POC Due: 11/22/22  Feeding  GOAL: Will complete feeding with: Independence    Treatment This Date:    Feeding Status: Addressed During Today?s Session    Feeding Assistance Level: Set up supervision    Assistance Required for: Other(comment) (Therapy assisting with placing tray in front of patient and adjusting food items on the tray for easy reaching to cup and food. Patient able to bring cup up to mouth to drink.)    Patient Position: Sitting in bed    Grooming  GOAL: Will complete grooming/simple hygiene with: Distant supervision (Seated)    Treatment This Date:    Grooming Status: Addressed During Today?s Session    Grooming Assistance Level: Minimal assist    Assistance Required for: Verbal cueing, Increased time to complete, Safety awareness/judgement    Task(s) Completed: Pt cued for patient to wash face, assisting with tactile cues and wiping lips.    Patient Position: Sitting in bed    UB Dressing  GOAL: Will complete upper body (UB) dressing with: Minimal assist    Treatment This Date:     UB Dressing Status: Not Addressed During Today?s Session            LB Dressing  GOAL: Will complete lower body (LB) dressing with: Maximal assist    Treatment This Date:    LB Dressing Status: Not Addressed During Today?s Session            Bathing  GOAL: Will complete bathing with: Maximal assist    Treatment This Date:     Bathing Status: Not Addressed During Today?s Session            Toileting  GOAL: Will complete toileting with: Maximal assist    Treatment This Date:     Toileting Status: Addressed During Today?s Session    Toileting Assist Level: Total assist    Assistance Required for: Supervision/safety, Clothing management up    Task(s) Completed: Pt with external urinary catheter placed upon therapy arrival. Throughout session, pt continued to reach down for external urinary catheter. Therapy  check for proper placement, and adjusting brief for proper placement.    Bed Mobility  GOAL: Will complete bed mobility with: Moderate assist    Treatment This Date:    Bed Mobility Status: Addressed During Today?s Session    Bed Mobility Assist Level: Maximal assist    Assistance Required for: Rolling    Task(s) Completed: Pt performed R lateral roll during brief check    Sit to/from Stand  GOAL: Will perform sit to/from stand with: Moderate assist, Least restrictive assistive  device    Treatment This Date:    Sit to from Stand Status: Not Addressed During Today?s Session          Functional Mobility  GOAL: Patient will perform functional mobility task with: Moderate assist, Least restrictive assistive device    Treatment This Date:    Functional Mobility Status: Not Addressed During Today?s Session            Toilet Transfer  GOAL: Patient will perform toilet transfer: Moderate assist, Least restrictive assistive device    Treatment This Date:    Toilet Transfer Status: Not Addressed During Today?s Session            Home Exercise Goal Status  GOAL: To complete HEP : With minimal verbal cues    Treatment This Date:    HEP Status: Not Addressed During Today?s Session                Patient/Caregiver Education  Education Provided: Role of OT, Use of call light for assistance, Education about rehab diagnosis  Patient Education Completed: Requires continued education  Family/Caregiver Education Completed: No family/caregiver present    Session Ending Location, Safety Info  End of Session Patient Location: Returned to bed  End of Session Patient Safety: Bed alarm on, Call light within reach, Bed in lowest position, Phone within reach   Interdisciplinary Communication: RN updated on patient performance, Spoke with PT    Plan  OT Patient/Family Goal: To go back home  Treatment Interventions to Address Currrent Goals: Self care/Home Management, Therapeutic activities, Therapeutic exercise   OT Frequency: 5x/wk  OT  Duration of Treatment: 2 weeks  OT Co-Treat?: No    Treatment Time/Minutes  Treatment- Time Initiated: 1226  Time Treatment Completed: 1243  Total Treatment Time: 17  Timed Code Minutes: 17 Minutes    Electronically signed by Windy Fast, MOT, OTR/L at 11/10/2022  4:43 PM EST

## 2022-11-10 NOTE — Progress Notes (Signed)
Formatting of this note is different from the original.   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  has a past surgical history that includes Hysterectomy.      OT Discharge Recommendations  1. Occupational Therapy services recommended to continue after discharge: Yes  2. Supervision for safety required: Full time supervision required: Unobserved for brief periods of time only but not left alone  3. Physical assistance required for daily care: Maximum Physical Assistance required: Caregiver supplies 51-75% of physical assistance  4. OT Discharge Equipment Needs If Returning Home: Other (Comment) (Defer to next level of care)      Functional Outcomes/Testing and Reporting   Daily Activity AM-PAC 6 Click:  10    General  - Subjective: Pt states, "This is more like it" after therapy scooted pt up towards HOB and positioned    - Current Medical Status: Janice Cunningham is a 88 y.o. female who presents to the Emergency Department for evaluation of lethargy and increased confusion.  Family reports that the patient ate very little yesterday and was not able to get up and move around and slept most of the day.  Pt positive for COVID. Pt was DC'd home and experienced fall. Returns to ED and admitted.    - Testing/Imaging: CT-ANGIO HEAD&NECK   IMPRESSION: 1.  No aneurysms, vascular occlusions, or intracranial stenoses identified. 2.  No significant stenosis in the extracranial vertebral or internal carotid arteries. 50% stenosis of left external carotid artery origin. XR-WRIST LEFT 2  VIEWS   IMPRESSION: No acute osseous abnormality. Mild to moderate degenerative joint disease. XR-FOREARM LEFT 2 VIEWS IMPRESSION: No acute osseous abnormality or significant degenerative joint disease.  XR-WRIST RIGHT 2 VIEWS  IMPRESSION: No acute osseous abnormality. Mild degenerative joint disease. XR-FOREARM RIGHT 2 VIEWS IMPRESSION: No acute osseous abnormality or significant degenerative joint disease.     XR-HIP BIL 3-4 VIEWS W/ PELVIS   IMPRESSION: No acute osseous abnormality. Mild hip joint space narrowing most consistent with degenerative osteoarthritis.    Home Environment  Source: Patient is unreliable historian (Pt with dementia at baseline and HOH, unable to get PLOF)      Home Equipment       Prior Functional Level  Patient is unreliable historian (Pt with dementia at baseline and HOH, unable to get PLOF)                   Premorbid Communication: Wears hearing aids, Hearing impaired, Wears glasses/contacts  Premorbid Cognition: Impaired    Weightbearing Status  Acute Weight Bearing Status: Full Weight Bearing (No restrictions ordered)    Therapy Precautions  Fall Risk  Additional Comments:     Orthotics/Braces      Session Beginning Location, Safety Info  Upon Arrival Patient Location: In bed   Upon Arrival Safety: Bed alarm on, Vitals stable, Other(comment) (Bed alarm going off upon therapy arrival)  Pertinent Information Since Last Session: No acute changes since last session     Pain Assessment  Pain Score:   Patient unable to rate but signs of pain noted  Pain Location: N/A  Pain Intervention: Patient reports pain tolerable level for therapy    Oxygen Administration Details  Oxygen (L/m): 0  Oxygen Delivery Device: RA    Pertinent Medication/Information  No s/s of distress. Once positioned in bed, pt starting to close eys, becoming drowsy. Pt HOH.     Task Current Assist Level Devices, Other Details   Grooming    Minimal assist    UB Dressing        LB Dressing        Bathing        Toileting     Total assist    Bed Mobility    Maximal assist    Sit to/from Stand        Toilet Transfer              Goals  OT POC Due: 11/22/22  Feeding  GOAL: Will complete feeding with: Independence    Treatment This Date:    Feeding Status: Addressed During Today?s Session    Feeding Assistance Level: Set up supervision    Assistance Required for: Other(comment) (Therapy assisting with placing tray in front of patient and adjusting food items on the tray for easy reaching to cup and food. Patient able to bring cup up to mouth to drink.)    Patient Position: Sitting in bed    Grooming  GOAL: Will complete grooming/simple hygiene with: Distant supervision (Seated)    Treatment This Date:    Grooming Status: Addressed During Today?s Session    Grooming Assistance Level: Minimal assist    Assistance Required for: Verbal cueing, Increased time to complete, Safety awareness/judgement    Task(s) Completed: Pt cued for patient to wash face, assisting with tactile cues and wiping lips.    Patient Position: Sitting in bed    UB Dressing  GOAL: Will complete upper body (UB) dressing with: Minimal assist    Treatment This Date:     UB Dressing Status: Not Addressed During Today?s Session            LB Dressing  GOAL: Will complete lower body (LB) dressing with: Maximal assist    Treatment This Date:    LB Dressing Status: Not Addressed During Today?s Session            Bathing  GOAL: Will complete bathing with: Maximal assist    Treatment This Date:     Bathing Status: Not Addressed During Today?s Session            Toileting  GOAL: Will complete toileting with: Maximal assist    Treatment This Date:     Toileting Status: Addressed During Today?s Session    Toileting Assist Level: Total assist    Assistance Required for: Supervision/safety, Clothing management up    Task(s) Completed: Pt with external urinary catheter placed upon therapy arrival. Throughout session, pt continued to reach down for external urinary catheter. Therapy  check for proper placement, and adjusting brief for proper placement.    Bed Mobility  GOAL: Will complete bed mobility with: Moderate assist    Treatment This Date:    Bed Mobility Status: Addressed During Today?s Session    Bed Mobility Assist Level: Maximal assist    Assistance Required for: Rolling    Task(s) Completed: Pt performed R lateral roll during brief check    Sit to/from Stand  GOAL: Will perform sit to/from stand with: Moderate assist, Least restrictive assistive  device    Treatment This Date:    Sit to from Stand Status: Not Addressed During Today?s Session          Functional Mobility  GOAL: Patient will perform functional mobility task with: Moderate assist, Least restrictive assistive device    Treatment This Date:    Functional Mobility Status: Not Addressed During Today?s Session            Toilet Transfer  GOAL: Patient will perform toilet transfer: Moderate assist, Least restrictive assistive device    Treatment This Date:    Toilet Transfer Status: Not Addressed During Today?s Session            Home Exercise Goal Status  GOAL: To complete HEP : With minimal verbal cues    Treatment This Date:    HEP Status: Not Addressed During Today?s Session                Patient/Caregiver Education  Education Provided: Role of OT, Use of call light for assistance, Education about rehab diagnosis  Patient Education Completed: Requires continued education  Family/Caregiver Education Completed: No family/caregiver present    Session Ending Location, Safety Info  End of Session Patient Location: Returned to bed  End of Session Patient Safety: Bed alarm on, Call light within reach, Bed in lowest position, Phone within reach   Interdisciplinary Communication: RN updated on patient performance, Spoke with PT    Plan  OT Patient/Family Goal: To go back home  Treatment Interventions to Address Currrent Goals: Self care/Home Management, Therapeutic activities, Therapeutic exercise   OT Frequency: 5x/wk  OT  Duration of Treatment: 2 weeks  OT Co-Treat?: No    Treatment Time/Minutes  Treatment- Time Initiated: 1226  Time Treatment Completed: 1243  Total Treatment Time: 17  Timed Code Minutes: 17 Minutes    Electronically signed by Margie Ann Ferguson, MOT, OTR/L at 11/10/2022  4:43 PM EST

## 2022-11-10 NOTE — Care Plan (Signed)
Formatting of this note might be different from the original.  End of Shift and Plan of Care Summary        Patient resting quietly on RA this shift SPO2 maintained above 90%    Goals per Patient Condition   Fall Prevention Plan: Patient will remain free from falls. See the Daily cares/safety flowsheet for intervention documentation.  Skin Integrity Plan: Patient skin integrity maintained. See Integumentary flowsheet for intervention documentation.  Isolation Precautions in Use: Isolation precautions in place. See Daily cares/safety flowsheet for intervention documentation.  DVT Prophylaxis in Use: Patient will remain free from DVT. See Daily cares/safety flowsheet for intervention documentation.  Pain Management Plan: Patient is being monitored for pain and response to treatment. See Vital Signs Complex Assessment flowsheet and MAR for intervention documentation.      Goals/Plan for shift  Patient/Family stated goal for shift: maintain comofort  Nursing goal for shift: maintain safety, comfort, promote hydration, maintain temp below 100.4 as able  Plan: Monitor I&O. ENcourage PO fluids. Monitor temp. administer meds as ordered, fall precautions    Goals/Plan for Hospital Stay  Patient/Family stated goal for hospital stay: feel better  Nursing goal for hospital stay: control infection and pain, safety  Plan administer meds as ordered    Electronically signed by Breanna Kay Burchett, RN at 11/10/2022  5:21 PM EST

## 2022-11-10 NOTE — Progress Notes (Signed)
Formatting of this note is different from the original.  Palliative Care Progress Note    Name: Janice Cunningham      Room/Bed: U1324/M0102-V  DOB: July 12, 1926         88 y.o.    11/10/2022   HCPOA/Family contact: [name] (daughter) [phone]   Code Status: DNR CCA    Impression/Plan     Patient was not physically assessed due to being in isolation for COVID-19.  Writer did collaborate with staff and thoroughly reviewed patient's chart.    This is a 88 year old female with a past medical history of dementia, ESBL and hypertension who presented to the ED with weakness.  Patient is seen through the window lying in her bed.  Per staff patient remains confused.    Patient is currently on room air with saturation 93%.  Staff reports patient is congested today.  Vitals are stable.  Most recent BP 153/59, receiving an hypertensives. Patient originally presented with weakness.  Patient found to be COVID-positive.  Patient is receiving vitamin D, C and zinc.  WBC count 8.5.  Pulmonology following.  Patient with poor intake.  Patient on regular diet.  Albumin 3.8.  Patient is weak, PT/OT services in place.  Patient appears comfortable at this time.    Patient does not have living will or healthcare power of attorney paperwork on file.  Per her daughters patient's daughter [name] is her Education officer, community.  Discussion yesterday with patient daughter [place] related to goals of care and current medical health status, verbalized understanding.  Patient current FULL CODE STATUS.  Discussion regarding CPR and/or intubation if needed an emergent situation.  Writer strongly recommended DNR CCA, no intubation CODE STATUS to patient's daughter, patient's daughter agreeable and verbalized understanding.  Paperwork completed.    Patient comes from private residence where she lives alone. Recommend skilled services following hospital discharge.  Family voiced that they would like patient to discharge to [facility].   Recommend palliative services following hospital discharge, will follow up.  Writer to continue discussions related to goals of care.  LSW aware.    1.  Changed to DNR CCA, no intubation CODE STATUS, confirmed with patient's daughter [place]  2.  Agree with current plan of care  3.  Continue isolation precautions  4.  Continue PT/OT services  5.  Recommend skilled services following hospital discharge  6.  Recommend palliative services following hospital discharge, will follow up  7.  Continue pastoral services for supportive care  8.  Writer to continue discussions related to goals of care  9.  Palliative care services will continue to follow    Objective:      Vital Signs:BP: 153/59 (11/28 0948)  Temp: 99 F (37.2 C) (11/28 0948)  Pulse: 84 (11/28 0948)  Resp: 18 (11/28 0948)  SpO2: 93 % (11/28 0948)  FiO2 (%): --  O2 Flow Rate (L/min): --  Cardiac (WDL): --  Cardiac Rhythm: --  Last Filed Weights    11/06/22 2248   Weight: 112 lb 7 oz (51 kg)    Body mass index is 19.92 kg/m. Temp (48hrs), Avg:98.8 F (37.1 C), Min:98.2 F (36.8 C), Max:99.5 F (37.5 C)      Dementia: yes  Delirium: no  Coma: no  Fall Risk: yes      PPS: 40%    Labs:   Recent Labs     11/08/22  0548 11/09/22  0358 11/10/22  0647   WBC 7.2 8.6 8.5  HEMOGLOBIN 12.9 13.1 13.5   HCT 37.7 38.1 39.6   PLT 160 173 194   NA 137 136 139   K 3.4* 4.2 3.9   CL 103 105 106   CO2 24 21 22    BUN 23 26* 36*   CREATININE 1.13 1.13 1.13   GLU 96 120* 97   MG 1.8 2.1 2.0     MEDS:   Current Facility-Administered Medications:     acetaminophen (TYLENOL) tablet 500-1,000 mg, 500-1,000 mg, Oral, EVERY 8 HOURS PRN, 1,000 mg at 11/07/22 2318 **OR** acetaminophen (TYLENOL) tablet 650 mg, 650 mg, Oral, EVERY 6 HOURS PRN, Keri Nicole Hollencamp, APRN-CNP, 650 mg at 11/10/22 0950    amLODIPine (NORVASC) tablet 10 mg, 10 mg, Oral, DAILY, Sivani 11/12/22, MD, 10 mg at 11/10/22 0950    ascorbic acid (vitamin C) (VITAMIN C) tablet 500 mg, 500 mg, Oral, DAILY,  Sivani 11/12/22, MD, 500 mg at 11/10/22 0950    benzonatate (TESSALON) capsule 200 mg, 200 mg, Oral, EVERY 8 HOURS PRN, Keri Nicole Hollencamp, APRN-CNP    chlorhexidine gluconate 2 % cloth, , Topical, DAILY AT 12 NOON, Keri Nicole Mullin, APRN-CNP, Given at 11/09/22 1211    cholecalciferol (VITAMIN D3) capsule 5,000 Units, 5,000 Units, Oral, DAILY, Sivani 1212, MD, 5,000 Units at 11/10/22 0950    docusate (COLACE) capsule 100 mg, 100 mg, Oral, TWO TIMES DAILY PRN, Keri Nicole Hollencamp, APRN-CNP    enoxaparin (LOVENOX) injection 30 mg, 30 mg, Subcutaneous, TWO TIMES DAILY, Keri Nicole Hollencamp, APRN-CNP, 30 mg at 11/10/22 0950    guaiFENesin (MUCINEX) extended-release tablet 600 mg, 600 mg, Oral, 2 times per day, Keri Nicole Hollencamp, APRN-CNP, 600 mg at 11/10/22 0950    ondansetron (ZOFRAN) injection 4 mg, 4 mg, Intravenous, EVERY 4 HOURS PRN **OR** ondansetron (ZOFRAN-ODT) disintegrating tablet 4 mg, 4 mg, Oral, EVERY 4 HOURS PRN, Keri Nicole Hollencamp, APRN-CNP    zinc sulfate (ZINCATE) capsule 220 mg, 220 mg, Oral, DAILY, Sivani 11/12/22, MD, 220 mg at 11/10/22 0950     MEDSPRN: acetaminophen **OR** acetaminophen, benzonatate, docusate, ondansetron **OR** ondansetron    MEDSINFUSIONS:         Imaging Studies: Reviewed in chart    Spiritual assessment     Seen per chaplain yes  Spiritual needs yes    Greater than 50% of time during encounter was spent counseling and/or coordination of care as documented above.    Electronically Signed:  11/12/22, APRN-CNP 11/10/2022      Electronically signed by 11/12/2022, DO at 11/11/2022  4:35 PM EST

## 2022-11-10 NOTE — Care Plan (Signed)
Formatting of this note might be different from the original.  End of Shift and Plan of Care Summary        Patient resting quietly on RA this shift SPO2 maintained above 90%    Goals per Patient Condition   Fall Prevention Plan: Patient will remain free from falls. See the Daily cares/safety flowsheet for intervention documentation.  Skin Integrity Plan: Patient skin integrity maintained. See Integumentary flowsheet for intervention documentation.  Isolation Precautions in Use: Isolation precautions in place. See Daily cares/safety flowsheet for intervention documentation.  DVT Prophylaxis in Use: Patient will remain free from DVT. See Daily cares/safety flowsheet for intervention documentation.  Pain Management Plan: Patient is being monitored for pain and response to treatment. See Vital Signs Complex Assessment flowsheet and MAR for intervention documentation.      Goals/Plan for shift  Patient/Family stated goal for shift: maintain comofort  Nursing goal for shift: maintain safety, comfort, promote hydration, maintain temp below 100.4 as able  Plan: Monitor I&O. ENcourage PO fluids. Monitor temp. administer meds as ordered, fall precautions    Goals/Plan for Hospital Stay  Patient/Family stated goal for hospital stay: feel better  Nursing goal for hospital stay: control infection and pain, safety  Plan administer meds as ordered    Electronically signed by Maurine Cane, RN at 11/10/2022  5:21 PM EST

## 2022-11-10 NOTE — Progress Notes (Signed)
Formatting of this note is different from the original.  Hospitalist Progress Note    Patient Name:  Janice Cunningham     Date:  11/10/2022    Admitted complain:  SOB    Active Problem/s:  COVID infection    Subjective:  Pt was seen in the room  She feels better than yesterday  She is on RA  Awaiting to be d/cd to ECF.  Pt is in the COVID isolation unit. To minimize the exposure,pt was examined with some limitations.  Objective:  Physical Exam:    BP (!) 142/34   Pulse 82   Temp 99 F (37.2 C)   Resp 18   Ht 5\' 3"  (1.6 m)   Wt 112 lb 7 oz (51 kg)   SpO2 94%   BMI 19.92 kg/m   BP (!) 142/34   Pulse 82   Temp 99 F (37.2 C)   Resp 18   Ht 5\' 3"  (1.6 m)   Wt 112 lb 7 oz (51 kg)   SpO2 94%   BMI 19.92 kg/m   General appearance: alert.  Lungs: clear to auscultation bilaterally  Heart: regular rate and rhythm, S1, S2 normal, no murmur, click, rub or gallop  Abdomen: soft, non-tender; bowel sounds normal; no masses,  no organomegaly  Extremities: extremities normal, atraumatic, no cyanosis or edema  Neurologic: Grossly normal    Labs:   CBC:   Recent Labs     11/08/22  0548 11/09/22  0358 11/10/22  0647   WBC 7.2 8.6 8.5   HEMOGLOBIN 12.9 13.1 13.5   HCT 37.7 38.1 39.6   MCV 93.0 92.9 93.1   PLT 160 173 194     BMP:   Recent Labs     11/08/22  0548 11/09/22  0358 11/10/22  0647   NA 137 136 139   K 3.4* 4.2 3.9   CL 103 105 106   CO2 24 21 22    BUN 23 26* 36*   CREATININE 1.13 1.13 1.13     LIVER PROFILE:   No results for input(s): "AST", "ALT", "LIPASE", "AMYLASE", "BILIDIR", "BILITOT", "ALKPHOS" in the last 72 hours.    PT/INR:  No results found for: "PROTIME", "INR"  PTT:  No results found for: "APTT"  UA: No results for input(s): "NITRITE", "COLORU", "PHUR", "MUCUS", "TRICHOMONAS", "YEAST", "BACTERIA", "SPECGRAV", "UROBILINOGEN", "GLUCOSEU", "KETONESU", "AMORPHOUS" in the last 72 hours.    Invalid input(s): "LABCAST", "WBCUA", "RBCUA", "CLARITYU", "LEUKOCYTESUR", "BILIRUBINUR",  "BLOODU"    Imaging:  CT-ANGIO HEAD&NECK W AND/OR WO CON W/POST IMG    Result Date: 11/07/2022  IMPRESSION: 1.  No aneurysms, vascular occlusions, or intracranial stenoses identified. 2.  No significant stenosis in the extracranial vertebral or internal carotid arteries. 50% stenosis of left external carotid artery origin. Electronically Signed by: 11/12/22, MD, 11/07/2022 5:10 PM     XR-WRIST LEFT 2 VIEWS    Result Date: 11/07/2022  IMPRESSION: No acute osseous abnormality. Mild to moderate degenerative joint disease.    Electronically Signed by: Gerald Leitz, MD, 11/07/2022 2:04 PM     XR-FOREARM LEFT 2 VIEWS    Result Date: 11/07/2022  IMPRESSION: No acute osseous abnormality or significant degenerative joint disease.    Electronically Signed by: Vallarie Mare, MD, 11/07/2022 2:03 PM     XR-WRIST RIGHT 2 VIEWS    Result Date: 11/07/2022  IMPRESSION: No acute osseous abnormality. Mild degenerative joint disease.    Electronically Signed by: Vallarie Mare, MD, 11/07/2022 2:02  PM     XR-FOREARM RIGHT 2 VIEWS    Result Date: 11/07/2022  IMPRESSION: No acute osseous abnormality or significant degenerative joint disease.    Electronically Signed by: Janit Pagan, MD, 11/07/2022 2:02 PM     XR-HIP BIL 3-4 VIEWS W/ PELVIS    Result Date: 11/07/2022  IMPRESSION: No acute osseous abnormality. Mild hip joint space narrowing most consistent with degenerative osteoarthritis.    Electronically Signed by: Janit Pagan, MD, 11/07/2022 2:01 PM      Assessment:  Principal Problem:    COVID-19      Plan:    COVID Infection.  Tested positive : 11/06/22  Admitted  to [place] isolation unit  Placed on Vitamin C, Vitamin D, & Zinc   Pulmonary  consulted  Hypertensive urgency  Continue  norvasc.  Acute Metabolic Encephalopathy  Agitated, treated with haldol  Dementia  HTN  Continue  norvasc.  Fall at home  Diarrhea  Hypokalemia.  K was replaced..  Hypomagnesemia.  Magnesium was replaced.  Advanced age  DVT prophylaxis.  Patient was  placed on lovenox subcutaneous bid.  DC Planning.  PT/OT/SS to see for the d/c planning.  To ECF  Code status - DNRCCA/DNI    High risk for clinical decline    Electronic Signature:  Hilbert Corrigan, MD 11/10/2022 9:44 AM     Due to the current situation with the COVID - 19 pandemic, to minimize the exposure, the chart in Epic was reviewed. Based on the chart review, discussion with nursing staff & pulmonary service medical decision was made.        Electronically signed by Hilbert Corrigan, MD at 11/10/2022 11:06 AM EST

## 2022-11-10 NOTE — Progress Notes (Signed)
Formatting of this note might be different from the original.                                                Discharge Planning Brief Note    Plan A: [facility] can accept pending out of network insurance approval    Plan B:     Reason for Intervention:   SW spoke with Chasity in admissions who reports no update concerning patient's pending precert.   Electronically signed by Curly Rim, LISW at 11/10/2022  3:22 PM EST

## 2022-11-10 NOTE — Progress Notes (Signed)
Formatting of this note might be different from the original.                                                Discharge Planning Brief Note    Plan A: [facility] can accept pending out of network insurance approval    Plan B:     Reason for Intervention:   SW spoke with Chasity in admissions who reports no update concerning patient's pending precert.   Electronically signed by Amy L Simpson, LISW at 11/10/2022  3:22 PM EST

## 2022-11-11 NOTE — Progress Notes (Signed)
Formatting of this note might be different from the original.                                                Discharge Planning Brief Note    Plan A: [facility] can accept pending out of network insurance approval    Plan B:+covid 11/24    Reason for Intervention:   SW contacted patient's daughter Alberta to obtain PLOF as therapy was unable to gather during evaluation.   Alberta confirmed that prior to hospitalization patient resided alone in her own apt.  She transferred and ambulated with a cane without support from others.  Daughter reports patient did not shower often but rather preferred to sponge bath which she completed independently. Patient completed dressing independently as well. Daughter stated that patient would at times require assistance with putting on her jacket.    Electronically signed by Amy L Simpson, LISW at 11/11/2022  4:03 PM EST

## 2022-11-11 NOTE — Progress Notes (Signed)
Formatting of this note might be different from the original.                                                Discharge Planning Brief Note    Plan A: [facility] can accept pending out of network insurance approval    Plan B:+covid 11/24    Reason for Intervention:   VM left for Chasity with admissions at [facility] requesting update on status of patient's precert.   10:20a Received TC from [facility] admissions stating insurance is requesting updated therapy notes.   Updates faxed as requested.   Electronically signed by Amy L Simpson, LISW at 11/11/2022 10:23 AM EST

## 2022-11-11 NOTE — Progress Notes (Signed)
Formatting of this note is different from the original.  Palliative Care Progress Note    Name: Janice Cunningham      Room/Bed: Q6578/I6962-X  DOB: 01-18-1926         88 y.o.    11/11/2022   HCPOA/Family contact: Jearld Fenton (daughter) [phone]   Code Status: DNR CCA    Impression/Plan     Patient was not physically assessed due to being in isolation for COVID-19.  Writer did collaborate with staff and thoroughly reviewed patient's chart.    This is a 88 year old female with a past medical history of dementia, ESBL and hypertension who presented to the ED with weakness.  Patient is seen through the window lying in her bed.  Staff at bedside helping feed.  Per staff patient remains confused.    Patient is currently on room air with saturation 92%.  Staff reports patient is congested today.  Vitals are stable.  Most recent BP 173/80, receiving an hypertensives. Patient originally presented with weakness.  Patient found to be COVID-positive.  Patient is receiving vitamin D, C and zinc.  WBC count 7.8.  Pulmonology following.  Patient with poor intake.  Patient on regular diet.  Albumin 3.8.  Patient is weak, PT/OT services in place.  Patient appears comfortable at this time.    Patient does not have living will or healthcare power of attorney paperwork on file.  Per her daughters patient's daughter Debera Lat is her Museum/gallery exhibitions officer.  Discussion Monday with patient daughter Sudan related to goals of care and current medical health status, verbalized understanding.  Patient current DNR CCA, no intubation CODE STATUS.  Paperwork on file.    Patient comes from private residence where she lives alone. Recommend skilled services following hospital discharge.  Family voiced that they would like patient to discharge to [facility].  Recommend palliative services following hospital discharge, will follow up.  Writer to continue discussions related to goals of care.  LSW aware.    1.  Continue DNR CCA, no  intubation CODE STATUS  2.  Agree with current plan of care  3.  Continue isolation precautions  4.  Continue PT/OT services  5.  Recommend skilled services following hospital discharge  6.  Recommend palliative services following hospital discharge, will follow up  7.  Continue pastoral services for supportive care  8.  Writer to continue discussions related to goals of care  9.  Palliative care services will continue to follow    Objective:      Vital Signs:BP: 152/57 (11/28 2018)  Temp: 97.8 F (36.6 C) (11/29 0500)  Pulse: 105 (11/28 2018)  Resp: 18 (11/28 2018)  SpO2: 92 % (11/28 2018)  FiO2 (%): --  O2 Flow Rate (L/min): --  Cardiac (WDL): --  Cardiac Rhythm: --  Last Filed Weights    11/06/22 2248   Weight: 112 lb 7 oz (51 kg)    Body mass index is 19.92 kg/m. Temp (48hrs), Avg:98.8 F (37.1 C), Min:97.8 F (36.6 C), Max:99.4 F (37.4 C)      Dementia: yes  Delirium: no  Coma: no  Fall Risk: yes      PPS: 40%    Labs:   Recent Labs     11/09/22  0358 11/10/22  0647 11/11/22  0743   WBC 8.6 8.5 7.8   HEMOGLOBIN 13.1 13.5 13.4   HCT 38.1 39.6 39.7   PLT 173 194 237   NA 136 139 140   K  4.2 3.9 3.6   CL 105 106 108*   CO2 21 22 23    BUN 26* 36* 34*   CREATININE 1.13 1.13 1.00   GLU 120* 97 106   MG 2.1 2.0 2.0     MEDS:   Current Facility-Administered Medications:     acetaminophen (TYLENOL) tablet 500-1,000 mg, 500-1,000 mg, Oral, EVERY 8 HOURS PRN, 1,000 mg at 11/07/22 2318 **OR** acetaminophen (TYLENOL) tablet 650 mg, 650 mg, Oral, EVERY 6 HOURS PRN, Keri Nicole Hollencamp, APRN-CNP, 650 mg at 11/10/22 2018    amLODIPine (NORVASC) tablet 10 mg, 10 mg, Oral, DAILY, Sivani Seabron Spates, MD, 10 mg at 11/10/22 0950    ascorbic acid (vitamin C) (VITAMIN C) tablet 500 mg, 500 mg, Oral, DAILY, Sivani Seabron Spates, MD, 500 mg at 11/10/22 0950    benzonatate (TESSALON) capsule 200 mg, 200 mg, Oral, EVERY 8 HOURS PRN, Keri Nicole Hollencamp, APRN-CNP    chlorhexidine gluconate 2 % cloth, , Topical, DAILY  AT 12 NOON, Keri Nicole [place], APRN-CNP, 1 each at 11/11/22 5397    cholecalciferol (VITAMIN D3) capsule 5,000 Units, 5,000 Units, Oral, DAILY, Sivani Seabron Spates, MD, 5,000 Units at 11/10/22 0950    docusate (COLACE) capsule 100 mg, 100 mg, Oral, TWO TIMES DAILY PRN, Keri Nicole Hollencamp, APRN-CNP    enoxaparin (LOVENOX) injection 30 mg, 30 mg, Subcutaneous, TWO TIMES DAILY, Keri Nicole Hollencamp, APRN-CNP, 30 mg at 11/10/22 2018    guaiFENesin (MUCINEX) extended-release tablet 600 mg, 600 mg, Oral, 2 times per day, Keri Nicole Hollencamp, APRN-CNP, 600 mg at 11/10/22 2018    ondansetron (ZOFRAN) injection 4 mg, 4 mg, Intravenous, EVERY 4 HOURS PRN **OR** ondansetron (ZOFRAN-ODT) disintegrating tablet 4 mg, 4 mg, Oral, EVERY 4 HOURS PRN, Keri Nicole Hollencamp, APRN-CNP    zinc sulfate (ZINCATE) capsule 220 mg, 220 mg, Oral, DAILY, Sivani Seabron Spates, MD, 220 mg at 11/10/22 0950     MEDSPRN: acetaminophen **OR** acetaminophen, benzonatate, docusate, ondansetron **OR** ondansetron    MEDSINFUSIONS:         Imaging Studies: Reviewed in chart    Spiritual assessment     Seen per chaplain yes  Spiritual needs yes    Greater than 50% of time during encounter was spent counseling and/or coordination of care as documented above.    Electronically Signed:  Claudette Head, APRN-CNP 11/11/2022      Electronically signed by Arlyss Repress, DO at 11/11/2022  4:35 PM EST

## 2022-11-11 NOTE — Progress Notes (Signed)
Formatting of this note is different from the original.  Hospitalist Progress Note    Patient Name:  Janice Cunningham     Date:  11/11/2022    Admitted complain:  SOB    Active Problem/s:  COVID infection    Subjective:  Pt was seen in the room  High BP noted.  She feels OK  She is on RA  Awaiting to be d/cd to ECF.  Pt is in the COVID isolation unit. To minimize the exposure,pt was examined with some limitations.  Objective:  Physical Exam:    BP (!) 152/57   Pulse (!) 105   Temp 97.8 F (36.6 C)   Resp 18   Ht 5\' 3"  (1.6 m)   Wt 112 lb 7 oz (51 kg)   SpO2 92%   BMI 19.92 kg/m   BP (!) 152/57   Pulse (!) 105   Temp 97.8 F (36.6 C)   Resp 18   Ht 5\' 3"  (1.6 m)   Wt 112 lb 7 oz (51 kg)   SpO2 92%   BMI 19.92 kg/m   General appearance: alert.  Lungs: clear to auscultation bilaterally  Heart: regular rate and rhythm, S1, S2 normal, no murmur, click, rub or gallop  Abdomen: soft, non-tender; bowel sounds normal; no masses,  no organomegaly  Extremities: extremities normal, atraumatic, no cyanosis or edema  Neurologic: Grossly normal    Labs:   CBC:   Recent Labs     11/09/22  0358 11/10/22  0647 11/11/22  0743   WBC 8.6 8.5 7.8   HEMOGLOBIN 13.1 13.5 13.4   HCT 38.1 39.6 39.7   MCV 92.9 93.1 93.5   PLT 173 194 237     BMP:   Recent Labs     11/09/22  0358 11/10/22  0647 11/11/22  0743   NA 136 139 140   K 4.2 3.9 3.6   CL 105 106 108*   CO2 21 22 23    BUN 26* 36* 34*   CREATININE 1.13 1.13 1.00     LIVER PROFILE:   No results for input(s): "AST", "ALT", "LIPASE", "AMYLASE", "BILIDIR", "BILITOT", "ALKPHOS" in the last 72 hours.    PT/INR:  No results found for: "PROTIME", "INR"  PTT:  No results found for: "APTT"  UA: No results for input(s): "NITRITE", "COLORU", "PHUR", "MUCUS", "TRICHOMONAS", "YEAST", "BACTERIA", "SPECGRAV", "UROBILINOGEN", "GLUCOSEU", "KETONESU", "AMORPHOUS" in the last 72 hours.    Invalid input(s): "LABCAST", "WBCUA", "RBCUA", "CLARITYU", "LEUKOCYTESUR", "BILIRUBINUR",  "BLOODU"    Imaging:  No results found.    Assessment:  Principal Problem:    COVID-19      Plan:    COVID Infection.  Tested positive : 11/06/22  Admitted  to COVID isolation unit  Placed on Vitamin C, Vitamin D, & Zinc   Pulmonary  consulted  Hypertensive urgency  Continue  norvasc.  Acute Metabolic Encephalopathy  Agitated, treated with haldol  Dementia  HTN  Continue  norvasc.  Continue metoprolol.  Fall at home  Diarrhea  Hypokalemia.  K was replaced..  Hypomagnesemia.  Magnesium was replaced.  Advanced age  DVT prophylaxis.  Patient was placed on lovenox subcutaneous bid.  DC Planning.  PT/OT/SS to see for the d/c planning.  To ECF  Code status - DNRCCA/DNI    High risk for clinical decline    Electronic Signature:  11/13/22, MD 11/11/2022 8:58 AM     Due to the current situation with the COVID -  19 pandemic, to minimize the exposure, the chart in Epic was reviewed. Based on the chart review, discussion with nursing staff & pulmonary service medical decision was made.        Electronically signed by Hilbert Corrigan, MD at 11/11/2022 10:47 AM EST

## 2022-11-11 NOTE — Care Plan (Signed)
Formatting of this note might be different from the original.  End of Shift and Plan of Care Summary          Goals per Patient Condition   Fall Prevention Plan: Patient will remain free from falls. See the Daily cares/safety flowsheet for intervention documentation.  Skin Integrity Plan: Patient skin integrity maintained. See Integumentary flowsheet for intervention documentation.  Isolation Precautions in Use: Isolation precautions in place. See Daily cares/safety flowsheet for intervention documentation.  DVT Prophylaxis in Use: Patient will remain free from DVT. See Daily cares/safety flowsheet for intervention documentation.  Pain Management Plan: Patient is being monitored for pain and response to treatment. See Vital Signs Complex Assessment flowsheet and MAR for intervention documentation.      Goals/Plan for shift  Patient/Family stated goal for shift: maintain comofort  Nursing goal for shift: maintain safety, comfort, promote hydration, maintain temp below 100.4 as able  Plan: Monitor I&O. ENcourage PO fluids. Monitor temp. administer meds as ordered, fall precautions    Goals/Plan for Hospital Stay  Patient/Family stated goal for hospital stay: feel better  Nursing goal for hospital stay: control infection and pain, safety  Plan administer meds as ordered    Electronically signed by Breanna Kay Burchett, RN at 11/11/2022  7:43 PM EST

## 2022-11-11 NOTE — Progress Notes (Signed)
Formatting of this note is different from the original.  Palliative Care Progress Note    Name: Janice Cunningham      Room/Bed: R6045/W0981-X  DOB: Jun 04, 1926         88 y.o.    11/11/2022   HCPOA/Family contact: Jearld Fenton (daughter) [phone]   Code Status: DNR CCA    Impression/Plan     Patient was not physically assessed due to being in isolation for COVID-19.  Writer did collaborate with staff and thoroughly reviewed patient's chart.    This is a 88 year old female with a past medical history of dementia, ESBL and hypertension who presented to the ED with weakness.  Patient is seen through the window lying in her bed.  Staff at bedside helping feed.  Per staff patient remains confused.    Patient is currently on room air with saturation 92%.  Staff reports patient is congested today.  Vitals are stable.  Most recent BP 173/80, receiving an hypertensives. Patient originally presented with weakness.  Patient found to be COVID-positive.  Patient is receiving vitamin D, C and zinc.  WBC count 7.8.  Pulmonology following.  Patient with poor intake.  Patient on regular diet.  Albumin 3.8.  Patient is weak, PT/OT services in place.  Patient appears comfortable at this time.    Patient does not have living will or healthcare power of attorney paperwork on file.  Per her daughters patient's daughter Debera Lat is her Museum/gallery exhibitions officer.  Discussion Monday with patient daughter Sudan related to goals of care and current medical health status, verbalized understanding.  Patient current DNR CCA, no intubation CODE STATUS.  Paperwork on file.    Patient comes from private residence where she lives alone. Recommend skilled services following hospital discharge.  Family voiced that they would like patient to discharge to [facility].  Recommend palliative services following hospital discharge, will follow up.  Writer to continue discussions related to goals of care.  LSW aware.    1.  Continue DNR CCA, no  intubation CODE STATUS  2.  Agree with current plan of care  3.  Continue isolation precautions  4.  Continue PT/OT services  5.  Recommend skilled services following hospital discharge  6.  Recommend palliative services following hospital discharge, will follow up  7.  Continue pastoral services for supportive care  8.  Writer to continue discussions related to goals of care  9.  Palliative care services will continue to follow    Objective:      Vital Signs:BP: 152/57 (11/28 2018)  Temp: 97.8 F (36.6 C) (11/29 0500)  Pulse: 105 (11/28 2018)  Resp: 18 (11/28 2018)  SpO2: 92 % (11/28 2018)  FiO2 (%): --  O2 Flow Rate (L/min): --  Cardiac (WDL): --  Cardiac Rhythm: --  Last Filed Weights    11/06/22 2248   Weight: 112 lb 7 oz (51 kg)    Body mass index is 19.92 kg/m. Temp (48hrs), Avg:98.8 F (37.1 C), Min:97.8 F (36.6 C), Max:99.4 F (37.4 C)      Dementia: yes  Delirium: no  Coma: no  Fall Risk: yes      PPS: 40%    Labs:   Recent Labs     11/09/22  0358 11/10/22  0647 11/11/22  0743   WBC 8.6 8.5 7.8   HEMOGLOBIN 13.1 13.5 13.4   HCT 38.1 39.6 39.7   PLT 173 194 237   NA 136 139 140   K  4.2 3.9 3.6   CL 105 106 108*   CO2 21 22 23    BUN 26* 36* 34*   CREATININE 1.13 1.13 1.00   GLU 120* 97 106   MG 2.1 2.0 2.0     MEDS:   Current Facility-Administered Medications:     acetaminophen (TYLENOL) tablet 500-1,000 mg, 500-1,000 mg, Oral, EVERY 8 HOURS PRN, 1,000 mg at 11/07/22 2318 **OR** acetaminophen (TYLENOL) tablet 650 mg, 650 mg, Oral, EVERY 6 HOURS PRN, Keri Nicole Hollencamp, APRN-CNP, 650 mg at 11/10/22 2018    amLODIPine (NORVASC) tablet 10 mg, 10 mg, Oral, DAILY, Sivani Seabron Spates, MD, 10 mg at 11/10/22 0950    ascorbic acid (vitamin C) (VITAMIN C) tablet 500 mg, 500 mg, Oral, DAILY, Sivani Seabron Spates, MD, 500 mg at 11/10/22 0950    benzonatate (TESSALON) capsule 200 mg, 200 mg, Oral, EVERY 8 HOURS PRN, Keri Nicole Hollencamp, APRN-CNP    chlorhexidine gluconate 2 % cloth, , Topical, DAILY  AT 12 NOON, Keri Nicole [place], APRN-CNP, 1 each at 11/11/22 5397    cholecalciferol (VITAMIN D3) capsule 5,000 Units, 5,000 Units, Oral, DAILY, Sivani Seabron Spates, MD, 5,000 Units at 11/10/22 0950    docusate (COLACE) capsule 100 mg, 100 mg, Oral, TWO TIMES DAILY PRN, Keri Nicole Hollencamp, APRN-CNP    enoxaparin (LOVENOX) injection 30 mg, 30 mg, Subcutaneous, TWO TIMES DAILY, Keri Nicole Hollencamp, APRN-CNP, 30 mg at 11/10/22 2018    guaiFENesin (MUCINEX) extended-release tablet 600 mg, 600 mg, Oral, 2 times per day, Keri Nicole Hollencamp, APRN-CNP, 600 mg at 11/10/22 2018    ondansetron (ZOFRAN) injection 4 mg, 4 mg, Intravenous, EVERY 4 HOURS PRN **OR** ondansetron (ZOFRAN-ODT) disintegrating tablet 4 mg, 4 mg, Oral, EVERY 4 HOURS PRN, Keri Nicole Hollencamp, APRN-CNP    zinc sulfate (ZINCATE) capsule 220 mg, 220 mg, Oral, DAILY, Sivani Seabron Spates, MD, 220 mg at 11/10/22 0950     MEDSPRN: acetaminophen **OR** acetaminophen, benzonatate, docusate, ondansetron **OR** ondansetron    MEDSINFUSIONS:         Imaging Studies: Reviewed in chart    Spiritual assessment     Seen per chaplain yes  Spiritual needs yes    Greater than 50% of time during encounter was spent counseling and/or coordination of care as documented above.    Electronically Signed:  Claudette Head, APRN-CNP 11/11/2022      Electronically signed by Arlyss Repress, DO at 11/11/2022  4:35 PM EST

## 2022-11-11 NOTE — Care Plan (Signed)
Formatting of this note might be different from the original.  End of Shift and Plan of Care Summary  COVID patient. Modified transmission based precautions in place. VSS, Respirations even and unlabored. Low grade temp managed w/PRN tylenol; effective. No SOB. Moist Non productive cough present occasionally. No S/S of acute distress noted.     A&1 w/intermittent confusion. Ability to assess orientation is impacted by the patient's severe hearing impairment, this impacts the patient's ability to follow commands at times. Hearing aides at bedside and in use w/staff encounters and as needed this shift. Staff speak in patient's left ear for best communication results.     Peripheral IV in place, patent, dressing C/D/I.     No c/o pain t/o shift.     Patient incontinent of bladder. External catheter in place.     Fall Prevention: safety maintained, fall risk protocol in place. Bed in lowest position with wheels locked. Yellow socks and bed alarm on for safety. Side rails up x2. Call light and bedside table within reach.     Discharge: Patient from home with expectation to transfer to SNF and transition to AL at [facility]. Pre-cert for out of network insurance pending per SW notes.     Plan of care ongoing.     Goals per Patient Condition   Fall Prevention Plan: Patient will remain free from falls. See the Daily cares/safety flowsheet for intervention documentation.  Skin Integrity Plan: Patient skin integrity maintained. See Integumentary flowsheet for intervention documentation.  Isolation Precautions in Use: Isolation precautions in place. See Daily cares/safety flowsheet for intervention documentation.  DVT Prophylaxis in Use: Patient will remain free from DVT. See Daily cares/safety flowsheet for intervention documentation.  Pain Management Plan: Patient is being monitored for pain and response to treatment. See Vital Signs Complex Assessment flowsheet and MAR for intervention documentation.      Goals/Plan  for shift  Patient/Family stated goal for shift: maintain comofort  Nursing goal for shift: maintain safety, comfort, promote hydration, maintain temp below 100.4 as able  Plan: Monitor I&O. ENcourage PO fluids. Monitor temp. administer meds as ordered, fall precautions    Goals/Plan for Hospital Stay  Patient/Family stated goal for hospital stay: feel better  Nursing goal for hospital stay: control infection and pain, safety  Plan administer meds as ordered    Electronically signed by Sydnee Cabal, RN at 11/11/2022  4:47 AM EST

## 2022-11-11 NOTE — Care Plan (Signed)
Formatting of this note might be different from the original.  End of Shift and Plan of Care Summary          Goals per Patient Condition   Fall Prevention Plan: Patient will remain free from falls. See the Daily cares/safety flowsheet for intervention documentation.  Skin Integrity Plan: Patient skin integrity maintained. See Integumentary flowsheet for intervention documentation.  Isolation Precautions in Use: Isolation precautions in place. See Daily cares/safety flowsheet for intervention documentation.  DVT Prophylaxis in Use: Patient will remain free from DVT. See Daily cares/safety flowsheet for intervention documentation.  Pain Management Plan: Patient is being monitored for pain and response to treatment. See Vital Signs Complex Assessment flowsheet and MAR for intervention documentation.      Goals/Plan for shift  Patient/Family stated goal for shift: maintain comofort  Nursing goal for shift: maintain safety, comfort, promote hydration, maintain temp below 100.4 as able  Plan: Monitor I&O. ENcourage PO fluids. Monitor temp. administer meds as ordered, fall precautions    Goals/Plan for Hospital Stay  Patient/Family stated goal for hospital stay: feel better  Nursing goal for hospital stay: control infection and pain, safety  Plan administer meds as ordered    Electronically signed by Maurine Cane, RN at 11/11/2022  7:43 PM EST

## 2022-11-11 NOTE — Care Plan (Signed)
Formatting of this note might be different from the original.  End of Shift and Plan of Care Summary  COVID patient. Modified transmission based precautions in place. VSS, Respirations even and unlabored. Low grade temp managed w/PRN tylenol; effective. No SOB. Moist Non productive cough present occasionally. No S/S of acute distress noted.     A&1 w/intermittent confusion. Ability to assess orientation is impacted by the patient's severe hearing impairment, this impacts the patient's ability to follow commands at times. Hearing aides at bedside and in use w/staff encounters and as needed this shift. Staff speak in patient's left ear for best communication results.     Peripheral IV in place, patent, dressing C/D/I.     No c/o pain t/o shift.     Patient incontinent of bladder. External catheter in place.     Fall Prevention: safety maintained, fall risk protocol in place. Bed in lowest position with wheels locked. Yellow socks and bed alarm on for safety. Side rails up x2. Call light and bedside table within reach.     Discharge: Patient from home with expectation to transfer to SNF and transition to AL at [facility]. Pre-cert for out of network insurance pending per SW notes.     Plan of care ongoing.     Goals per Patient Condition   Fall Prevention Plan: Patient will remain free from falls. See the Daily cares/safety flowsheet for intervention documentation.  Skin Integrity Plan: Patient skin integrity maintained. See Integumentary flowsheet for intervention documentation.  Isolation Precautions in Use: Isolation precautions in place. See Daily cares/safety flowsheet for intervention documentation.  DVT Prophylaxis in Use: Patient will remain free from DVT. See Daily cares/safety flowsheet for intervention documentation.  Pain Management Plan: Patient is being monitored for pain and response to treatment. See Vital Signs Complex Assessment flowsheet and MAR for intervention documentation.      Goals/Plan  for shift  Patient/Family stated goal for shift: maintain comofort  Nursing goal for shift: maintain safety, comfort, promote hydration, maintain temp below 100.4 as able  Plan: Monitor I&O. ENcourage PO fluids. Monitor temp. administer meds as ordered, fall precautions    Goals/Plan for Hospital Stay  Patient/Family stated goal for hospital stay: feel better  Nursing goal for hospital stay: control infection and pain, safety  Plan administer meds as ordered    Electronically signed by Andrea M Wamprecht, RN at 11/11/2022  4:47 AM EST

## 2022-11-11 NOTE — Progress Notes (Signed)
Formatting of this note might be different from the original.                                                Discharge Planning Brief Note    Plan A: [facility] can accept pending out of network insurance approval    Plan B:+covid 11/24    Reason for Intervention:   SW contacted patient's daughter [place] to obtain PLOF as therapy was unable to gather during evaluation.   [place] confirmed that prior to hospitalization patient resided alone in her own apt.  She transferred and ambulated with a cane without support from others.  Daughter reports patient did not shower often but rather preferred to sponge bath which she completed independently. Patient completed dressing independently as well. Daughter stated that patient would at times require assistance with putting on her jacket.    Electronically signed by Curly Rim, LISW at 11/11/2022  4:03 PM EST

## 2022-11-11 NOTE — Progress Notes (Signed)
Formatting of this note might be different from the original.                                                Discharge Planning Brief Note    Plan A: [facility] can accept pending out of network insurance approval    Plan B:+covid 11/24    Reason for Intervention:   VM left for [place] with admissions at [facility] requesting update on status of patient's precert.   10:20a Received TC from [facility] admissions stating insurance is requesting updated therapy notes.   Updates faxed as requested.   Electronically signed by Curly Rim, LISW at 11/11/2022 10:23 AM EST

## 2022-11-12 NOTE — Progress Notes (Signed)
Formatting of this note is different from the original.  Images from the original note were not included.     Dysphagia  Evaluation    Recommendations: Continue Regular diet (selecting soft/moist items) and Thin liquids - straws ok. Medication whole or crushed with puree. May need 1:1 assist to feed.     Admit date:  11/06/2022          Today's Date: 11/12/2022      Patient Name/MR#:  Janice Cunningham     [id number]   Current Room:  F5540/F5540-A   Admitting Diagnosis: Weakness generalized [R53.1]  COVID-19 [U07.1]    Admitting Provider: Ranjit Katneni, MD   Past Medical/Surgical History:   has a past medical history of COVID-19 virus infection (11/06/2022), Dementia (HCC), and ESBL (extended spectrum beta-lactamase) producing bacteria infection (02/26/2021).  has a past surgical history that includes Hysterectomy.      SLP Discharge Recommendations  1. Patient will be seen by Speech Therapy Services for dysphagia therapy and progressed as tolerated: No  2. Communication/Cognition Assistance Level - Caregiver Assistance Needed:: Total Supervision required: Pt should not be left alone. Requires constant cueing/direction to maintain safety and physical assistance required for task completion.  3. Physical Assistance required for oral intake/meals:: Intermittent Supervision/Assistance required: Requires assist with meal setup, proper diet modifications, and cues with meal to use strategies. May require assistance preparing meals to dysphagia diet recommendations which may include thickened liquids.    General  - Subjective:      - Current Medical Status: Janice Cunningham is a 88 y.o. female who presents to the Emergency Department for evaluation of lethargy and increased confusion.  Family reports that the patient ate very little yesterday and was not able to get up and move around and slept most of the day.  Pt positive for COVID. Pt was DC'd home and experienced fall. Returns to ED and admitted.     - Testing/Imaging:   CXR  11/06/22: IMPRESSION:    No acute pulmonary disease.    - Past therapy for current diagnosis, illness or injury?: Unknown (Patient seen this date for swallow evaluation. Unsure of prior difficulty swallowing - Reg diet/Thin liquids during this admission. No prior ST in Epic review.)      - Prior Instrumental Testing: No      - Premorbid Communication: Unable to determine (Pt extremely HOH)    - Adaptive Devices Used      Hearing Aids: Left in patient's  ear(s)      - Patient Safety/Staff Communication       End of Session Patient Safety: Bed alarm on, Bed in lowest position     Interdisciplinary Communication: RN updated    Pain Assessment  Pain Score: Patient unable to rate but no noted signs of pain        Cognition  Mental Status: Reduced level of alertness, Cooperative  Follows Commands: 1 step, Inconsistently  Orientation: Person  Barriers to Learning: Hearing, Judgement/insight    Respiratory Status    Oxygen    Oxygen Delivery Device: Room air      Secretion Management: Xersotomia  Breath Sounds (documented per chart review)   Standard Breath Sounds  Respiratory Assessment (WDL): Exception to WDL  All Chest Area Breath Sounds: Anterior Chest  Left Upper Chest Breath Sounds: Clear  Left Lower Chest Breath Sounds: Diminished  Right Upper Chest Breath Sounds: Exp Wheezes, Diminished  Right Middle Chest Breath Sounds: Diminished  Right Lower Chest Breath Sounds: Diminished    Respiratory Pattern: Eupneic  Chest Assessment: Chest Expansion Symmetrical    Articulation and Voice  Articulation: Non-verbal/unable to assess  Voice: Non-verbal/unable to assess    Ability to Feed Self  Ability to Feed Self  Ability to Self Feed: Unable to assess    Oral Exam     Labial Functional for Intake   Lingual Functional for Intake   Velar     Facial     Mandibular     Dentition Edentulous (Dentures present - unable to place this date)     Swallow Trials    Trial Presentation Vol Presented Oral Pharyngeal Comments   1 Ice, Therapist  fed 1  (Decreased manipulation and discoordinated a-p transit) Suspected delayed swallow    2 Thin, Straw 4oz  (Mild decreased control) Suspected delayed swallow (No overt s/s of aspiration)    3 Puree, Therapist fed    (Decreased formation - lingual residue, able to clear with liquid wash) Suspected delayed swallow (No overt s/s of aspiration)    4 Soft solid, Therapist fed   Prolonged mastication (Decreased formation and lingual residue - able to clear with alt consistency and liquid wash) Suspected delayed swallow (No overt s/s of aspiration)      Swallow Strategies Trialed  Liquid rinse, Verbal cues    Patient/Caregiver Education  Education Provided: Role of SLP, Plan of Care  Patient Education Completed: Other (comment) (Unable to teach)  Family/Caregiver Education Completed: No family/caregiver present    Assessment Summary/Severity  - Diagnosis, Potential, Aspiration Risk    Swallow- Rehab Diagnosis: Mild oral dysphagia, Moderate oral dysphagia, Mild pharyngeal dysphagia    Swallow- Rehab Potential: Fair    Swallow - Risk of Aspiration/Complications: Low    - Recommendations    Liquid Recommendations: Thin    Texture Recommendations: Regular (selecting soft/moist items)    Medication Administration Recommendations: Other(comment) (Whole or crushed with puree)      Recommended Strategies: Small bites/sips, Alternate bites/sips, Upright positioning, Upright 30 minutes after meal    - Supervision/Additional Comments    Supervision/Assist with Oral Intake: Strict 1:1 supervision to enforce strategies, Assist with set-up    Assessment Summary Comments: Mild oral dysphagia, characterized by decreased initiation, decreased bolus control, discooridnated a-p transit, increased mastication time, and decreased formation. Pt with increased lethargy overall this date. Mild delay in swallow initiation and no overt s/s of aspiration. Suspect patient likely at baseline - swallow appears to be functional for normal aging  swallow. Recommend continue Regular diet (selecting soft/moist items) and Thin liquids - straws ok. Medication whole or crushed with puree. May need 1:1 assist to feed.     Plan  Patient/Family Goal: None stated  Swallow- Treatment Procedures/Intervention to Address Current Goals: Bedside Treatment  Swallow Frequency: 3  Swallow- Duration: 1 week  Recommend Continued Dysphagia Therapy on Discharge: No    Goals:   LTG:  Pt will demonstrate tolerance of least restrictive diet level for duration of hospitalization.    STG:  Pt will safely tolerate Regular diet without complications of dysphagia with >90% of PO intake.  Pt will safely tolerate Thin liquids without s/s of aspiration risk with >90% of PO intake.  Pt will demonstrate ability to employ safe swallow strategies with min-mod assist.   Pt will participate in VFSS as appropriate    POC DUE  Dysphagia POC Due: 11/19/22    Evaluation/Minutes  Swallow -Time Initial Eval Initiated: 0830  Swallow -Time Initial Eval Time Out: 0840  Swallow Eval Total Treatment   Time: 10        Electronically signed by Charm Barges, SLP at 11/12/2022  9:13 AM EST

## 2022-11-12 NOTE — Progress Notes (Signed)
Formatting of this note might be different from the original.                                                Discharge Planning Brief Note    Plan A: [facility] can accept pending out of network insurance approval    Plan B:+covid 11/24    Reason for Intervention: Care Mgt called insurance contact at [facility] to follow up on review.  Was informed that they did receive updated clinical informations as requested and is being  reviewed today by clinical team.   Electronically signed by Bethany J Wilson-Paul, LISW at 11/12/2022  3:54 PM EST

## 2022-11-12 NOTE — Care Plan (Signed)
Formatting of this note might be different from the original.  End of Shift and Plan of Care Summary        No changes overnight.     Goals per Patient Condition   Fall Prevention Plan: Patient will remain free from falls. See the Daily cares/safety flowsheet for intervention documentation.  Skin Integrity Plan: Patient skin integrity maintained. See Integumentary flowsheet for intervention documentation.  Isolation Precautions in Use: Isolation precautions in place. See Daily cares/safety flowsheet for intervention documentation.  DVT Prophylaxis in Use: Patient will remain free from DVT. See Daily cares/safety flowsheet for intervention documentation.  Pain Management Plan: Patient is being monitored for pain and response to treatment. See Vital Signs Complex Assessment flowsheet and MAR for intervention documentation.      Goals/Plan for shift  Patient/Family stated goal for shift: maintain comfort  Nursing goal for shift: maintain safety, comfort, promote hydration,remain Afebrile, remain free from falls  Plan: Monitor I&O. ENcourage PO fluids. Monitor temp. administer meds as ordered, fall precautions    Goals/Plan for Hospital Stay  Patient/Family stated goal for hospital stay: maintain comfort  Nursing goal for hospital stay: maintain safety, comfort, promote hydration,remain Afebrile, remain free from falls  Plan Monitor I&O. ENcourage PO fluids. Monitor temp. administer meds as ordered, fall precautions    Electronically signed by Rose Ishioma Alao, RN at 11/12/2022  5:57 AM EST

## 2022-11-12 NOTE — Progress Notes (Signed)
Formatting of this note is different from the original.  Images from the original note were not included.     Dysphagia  Evaluation    Recommendations: Continue Regular diet (selecting soft/moist items) and Thin liquids - straws ok. Medication whole or crushed with puree. May need 1:1 assist to feed.     Admit date:  11/06/2022          Today's Date: 11/12/2022      Patient Name/MR#:  Janice Cunningham     [id number]   Current Room:  Z0258/N2778-E   Admitting Diagnosis: Weakness generalized [R53.1]  COVID-19 [U07.1]    Admitting Provider: Clarisse Gouge, MD   Past Medical/Surgical History:   has a past medical history of COVID-19 virus infection (11/06/2022), Dementia (HCC), and ESBL (extended spectrum beta-lactamase) producing bacteria infection (02/26/2021).  has a past surgical history that includes Hysterectomy.      SLP Discharge Recommendations  1. Patient will be seen by Speech Therapy Services for dysphagia therapy and progressed as tolerated: No  2. Communication/Cognition Assistance Level - Caregiver Assistance Needed:: Total Supervision required: Pt should not be left alone. Requires constant cueing/direction to maintain safety and physical assistance required for task completion.  3. Physical Assistance required for oral intake/meals:: Intermittent Supervision/Assistance required: Requires assist with meal setup, proper diet modifications, and cues with meal to use strategies. May require assistance preparing meals to dysphagia diet recommendations which may include thickened liquids.    General  - Subjective:      - Current Medical Status: Janice Cunningham is a 88 y.o. female who presents to the Emergency Department for evaluation of lethargy and increased confusion.  Family reports that the patient ate very little yesterday and was not able to get up and move around and slept most of the day.  Pt positive for COVID. Pt was DC'd home and experienced fall. Returns to ED and admitted.     - Testing/Imaging:   CXR  11/06/22: IMPRESSION:    No acute pulmonary disease.    - Past therapy for current diagnosis, illness or injury?: Unknown (Patient seen this date for swallow evaluation. Unsure of prior difficulty swallowing - Reg diet/Thin liquids during this admission. No prior ST in Epic review.)      - Prior Instrumental Testing: No      - Premorbid Communication: Unable to determine (Pt extremely HOH)    - Adaptive Devices Used      Hearing Aids: Left in patient's  ear(s)      - Patient Safety/Staff Communication       End of Session Patient Safety: Bed alarm on, Bed in lowest position     Interdisciplinary Communication: RN updated    Pain Assessment  Pain Score: Patient unable to rate but no noted signs of pain        Cognition  Mental Status: Reduced level of alertness, Cooperative  Follows Commands: 1 step, Inconsistently  Orientation: Person  Barriers to Learning: Hearing, Judgement/insight    Respiratory Status    Oxygen    Oxygen Delivery Device: Room air      Secretion Management: Xersotomia  Breath Sounds (documented per chart review)   Standard Breath Sounds  Respiratory Assessment (WDL): Exception to [facility]  All Chest Area Breath Sounds: Anterior Chest  Left Upper Chest Breath Sounds: Clear  Left Lower Chest Breath Sounds: Diminished  Right Upper Chest Breath Sounds: Exp Wheezes, Diminished  Right Middle Chest Breath Sounds: Diminished  Right Lower Chest Breath Sounds: Diminished  Respiratory Pattern: Eupneic  Chest Assessment: Chest Expansion Symmetrical    Articulation and Voice  Articulation: Non-verbal/unable to assess  Voice: Non-verbal/unable to assess    Ability to Feed Self  Ability to Feed Self  Ability to Self Feed: Unable to assess    Oral Exam     Labial Functional for Intake   Lingual Functional for Intake   Velar     Facial     Mandibular     Dentition Edentulous (Dentures present - unable to place this date)     Swallow Trials    Trial Presentation Vol Presented Oral Pharyngeal Comments   1 Ice, Therapist  fed 1  (Decreased manipulation and discoordinated a-p transit) Suspected delayed swallow    2 Thin, Straw 4oz  (Mild decreased control) Suspected delayed swallow (No overt s/s of aspiration)    3 Puree, Therapist fed    (Decreased formation - lingual residue, able to clear with liquid wash) Suspected delayed swallow (No overt s/s of aspiration)    4 Soft solid, Therapist fed   Prolonged mastication (Decreased formation and lingual residue - able to clear with alt consistency and liquid wash) Suspected delayed swallow (No overt s/s of aspiration)      Swallow Strategies Trialed  Liquid rinse, Verbal cues    Patient/Caregiver Education  Education Provided: Role of SLP, Plan of Care  Patient Education Completed: Other (comment) (Unable to teach)  Family/Caregiver Education Completed: No family/caregiver present    Assessment Summary/Severity  - Diagnosis, Potential, Aspiration Risk    Swallow- Rehab Diagnosis: Mild oral dysphagia, Moderate oral dysphagia, Mild pharyngeal dysphagia    Swallow- Rehab Potential: Fair    Swallow - Risk of Aspiration/Complications: Low    - Recommendations    Liquid Recommendations: Thin    Texture Recommendations: Regular (selecting soft/moist items)    Medication Administration Recommendations: Other(comment) (Whole or crushed with puree)      Recommended Strategies: Small bites/sips, Alternate bites/sips, Upright positioning, Upright 30 minutes after meal    - Supervision/Additional Comments    Supervision/Assist with Oral Intake: Strict 1:1 supervision to enforce strategies, Assist with set-up    Assessment Summary Comments: Mild oral dysphagia, characterized by decreased initiation, decreased bolus control, discooridnated a-p transit, increased mastication time, and decreased formation. Pt with increased lethargy overall this date. Mild delay in swallow initiation and no overt s/s of aspiration. Suspect patient likely at baseline - swallow appears to be functional for normal aging  swallow. Recommend continue Regular diet (selecting soft/moist items) and Thin liquids - straws ok. Medication whole or crushed with puree. May need 1:1 assist to feed.     Plan  Patient/Family Goal: None stated  Swallow- Treatment Procedures/Intervention to Address Current Goals: Bedside Treatment  Swallow Frequency: 3  Swallow- Duration: 1 week  Recommend Continued Dysphagia Therapy on Discharge: No    Goals:   LTG:  Pt will demonstrate tolerance of least restrictive diet level for duration of hospitalization.    STG:  Pt will safely tolerate Regular diet without complications of dysphagia with >90% of PO intake.  Pt will safely tolerate Thin liquids without s/s of aspiration risk with >90% of PO intake.  Pt will demonstrate ability to employ safe swallow strategies with min-mod assist.   Pt will participate in VFSS as appropriate    POC DUE  Dysphagia POC Due: 11/19/22    Evaluation/Minutes  Swallow -Time Initial Eval Initiated: 0830  Swallow -Time Initial Eval Time Out: 0840  Swallow Eval Total Treatment  Time: 10        Electronically signed by Charm Barges, SLP at 11/12/2022  9:13 AM EST

## 2022-11-12 NOTE — Progress Notes (Signed)
Formatting of this note is different from the original.   Physical Therapy Treatment    Admit date:  11/06/2022          Today's Date: 11/12/2022      Patient Name/MR#:  Janice Cunningham     [id number]   Current Room:  U2353/I1443-X   Admitting Diagnosis: Weakness generalized [R53.1]  COVID-19 [U07.1]    Admitting Provider: Clarisse Gouge, MD       PT Discharge Recommendations  1. Physical Therapy services recommended to continue after discharge: Yes  2. Supervision for safety required: Caregiver will need to provide full time supervision: Unobserved for brief periods of time only but not left alone  3. Physical Assistance required for daily care: Caregiver will need to provide total physical assistance: Caregiver will need to supply 76-100% of physical assistance  4. PT Discharge Equipment Needs if Returning Home: Other (Comment)      PT Discharge Equipment Comments: continue to assess    Functional Outcomes/Testing and Reporting    Basic Mobility: AM-PAC 6 Clicks:7    General  - Subjective: "Jady"    - Current Medical Status: Janice Cunningham is a 88 y.o. female who presents to the Emergency Department for evaluation of lethargy and increased confusion.  Family reports that the patient ate very little yesterday and was not able to get up and move around and slept most of the day.  Pt positive for COVID. Pt was DC'd home and experienced fall. Returns to ED and admitted.    - Testing/Imaging: CT-ANGIO HEAD&NECK   IMPRESSION: 1.  No aneurysms, vascular occlusions, or intracranial stenoses identified. 2.  No significant stenosis in the extracranial vertebral or internal carotid arteries. 50% stenosis of left external carotid artery origin. XR-WRIST LEFT 2 VIEWS   IMPRESSION: No acute osseous abnormality. Mild to moderate degenerative joint disease. XR-FOREARM LEFT 2 VIEWS IMPRESSION: No acute osseous abnormality or significant degenerative joint disease.  XR-WRIST RIGHT 2 VIEWS  IMPRESSION: No acute osseous abnormality. Mild  degenerative joint disease. XR-FOREARM RIGHT 2 VIEWS IMPRESSION: No acute osseous abnormality or significant degenerative joint disease.     XR-HIP BIL 3-4 VIEWS W/ PELVIS   IMPRESSION: No acute osseous abnormality. Mild hip joint space narrowing most consistent with degenerative osteoarthritis.    Home Environment  Source: Patient is unreliable historian (Pt with dementia at baseline and HOH, unable to get PLOF)                  Home Equipment       Prior Functional Level  Functional Transfers: Other (comment) (unreliable historian. unable to provide subjective information.)  Functional Mobility: Other (comment) (unreliable historian. unable to provide subjective information.)    Premorbid Communication: Hearing impaired, Wears hearing aids  Premorbid Cognition: Impaired (per chart, pt with dementia and slight confusion at baseline.)    Weightbearing Status   (n/a)    Therapy Precautions  Fall Risk  Additional Comments:     Orthotics/Braces  Orthotics, Braces: Not applicable    Session Beginning Location, Safety Info  Upon Arrival Patient Location: In bed  Upon Arrival Safety: Bed alarm on  Pertinent Information Since Last Session: No acute changes since last session    Pain Assessment  Pain Score: Patient unable to rate but no noted signs of pain        Oxygen Administration Details    Oxygen Delivery Device: RA      Observations: no s/s of distress. following t/f to chair, pt  consistently satting 89-90% throughout end of session. RN made aware of pt's SpO2. attempted to communicate through written instructions as opposed to verbal - minimally effective.    Activity Tolerance  Functional Activity Tolerance: Tolerate 10-20 min activity with multiple rest breaks        Task Current Assist Level Devices, Other Details   Bed Mobility      Rolling Right Left      Supine to Sit Total assist (total x2)    Sit to Supine      Sit to/from Stand Total assist (Max A x2. performs 3 stands from EOB to FWW. pt with strong  posterir lean. despite tactile cues for hip extension and upright posture. performs one stand w/out A.D. Max A x2.) On Evaluation    Subsequent Treatment    Transfer       Bed to Chair Total assist (pt unable to takes steps to complete t/f with FWW. for improved safety, performed t/f without A.D, total A x2.)     Gait Training      On Evaluation      Subsequent Treatment    Distance:  On Evaluation: 4 feet   Subsequent Treatment:     Stair Training      On Evaluation:    Subsequent Treatment:       Supine To Sit    GOAL: Will perform supine to sit with: Minimal assist      Treatment This Date:     Supine to Sit Status: Addressed during today's session    Supine to Sit Assistance: Total assist (total x2)    Assistance Required for: Bilateral LEs, Trunk, Physical assist for scooting hips to edge of bed    Therapeutic Modifications During Session: HOB elevated    Sit to Supine    GOAL: Will perform sit to supine with: Minimal assist      Treatment This Date:    Sit to Supine Status: Not Addressed during today?s session          Sit to/from Stand    GOAL: Will perform sit to/from stand with: Minimal assist, Least restrictive device      Treatment This Date:     Sit to/From Stand Status: Addressed during today's session    Sit to Stand Assistance: Total assist (Max A x2. performs 3 stands from EOB to FWW. pt with strong posterir lean. despite tactile cues for hip extension and upright posture. performs one stand w/out A.D. Max A x2.)      Assistance Required for: Clearing buttocks from surface, Proper technique    Therapeutic Modifications Required for: Bed height elevated    Bed to Chair    GOAL: Will perform sit to/from chair with: Minimal assist, Least restrictive assistive device      Treatment This Date:     Bed to Chair: Addressed during today's session    Bed to Chair Assistance: Total assist (pt unable to takes steps to complete t/f with FWW. for improved safety, performed t/f without A.D, total A x2.)     Assistive Devices: No device    Assistance Required for: Safety, Controlled descent    Therapeutic Modifications Required for: Pivot     Gait Training    GOAL: Will ambulate: 100-150 feet, Minimal assist, Least restrictive assistive device      Treatment This Date:    Gait Training Status: Not Addressed during today?s session              Balance  GOAL: Will participate in: Static, Dynamic, For 5 minutes, with minimal assist (LRAD)    Balance - Standing    Treatment This Date:    Standing Balance Status: Not Addressed during today?s session, Addressed during today's session    Standing Patient Position: FWW    Standing Level of Assist: Total assist (max A x2)    Standing Balance Support Assistance Required for: Physical assist for correcting posterior lean      Standing Tolerance Duration: 15 seconds x3    Balance - Sitting    Treatment This Date:     Sitting Balance Status: Addressed during today's session    Sitting Patient Position: Feet supported, Edge of bed, BUE support    Sitting Level of Assist: Minimal assist    Sitting Balance Support Assistance Required for: Physical assist for correcting posterior lean      Sitting Tolerance Duration: 5 min    Home Exercise    GOAL: Patient to complete HEP: Seated, Standing, With least restrictive assistive device, With minimal verbal cues      Treatment This Date:    HEP Goal Status: Not Addressed During Today?s Session                POC DUE  11/22/22    Patient/Caregiver Education  Education Provided: Role of PT, Use of call light for assistance, Importance of mobility  Patient Education Completed: Requires continued Programmer, systems Education Completed: No family/caregiver present    Session Ending Location, Safety Info  End of Session Patient Location: Sitting in chair  End of Session Patient Safety: Chair alarm on, Gait belt used, Call light within reach, Lines and drains intact, Phone within reach  Interdisciplinary Communication: Spoke with RN prior  to session, RN notified about issues during session, RN updated on patient performance, Spoke with OT    Plan  PT Patient/Family Goal: unable to state goal at time of evaluation  Treatment: Therapeutic activities, Therapeutic exercise, Gait training, Neuromuscular re-education, Self-care  PT Frequency: 5x/wk  PT Duration of Treatment: 2 weeks  PT Co-Treat: Yes  Reason for Cotreatment: Poor activity tolerance for participation in multiple treatment sessions, Requires > mod assist for progressive mobility out of bed activities    Treatment Time/Minutes  Time Treatment Initiated: 0917  Time Treament Completed: 1001  Total Treatment Time: 44  Timed Code Minutes: 44 Minutes        Electronically signed by Loman Brooklyn, PT at 11/12/2022 12:45 PM EST

## 2022-11-12 NOTE — Progress Notes (Signed)
Formatting of this note is different from the original.  Hospitalist Progress Note    Patient Name:  Janice Cunningham     Date:  11/12/2022    Admitted complain:  SOB    Active Problem/s:  COVID infection    Subjective:  Pt was seen in the room  She feels OK  She is on RA  Awaiting to be d/cd to ECF.  Pt is in the COVID isolation unit. To minimize the exposure,pt was examined with some limitations.  Objective:  Physical Exam:    BP 136/66   Pulse 96   Temp 97.6 F (36.4 C)   Resp 16   Ht 5\' 3"  (1.6 m)   Wt 108 lb 7.5 oz (49.2 kg)   SpO2 92%   BMI 19.21 kg/m   BP 136/66   Pulse 96   Temp 97.6 F (36.4 C)   Resp 16   Ht 5\' 3"  (1.6 m)   Wt 108 lb 7.5 oz (49.2 kg)   SpO2 92%   BMI 19.21 kg/m   General appearance: alert.  Lungs: clear to auscultation bilaterally  Heart: regular rate and rhythm, S1, S2 normal, no murmur, click, rub or gallop  Abdomen: soft, non-tender; bowel sounds normal; no masses,  no organomegaly  Extremities: extremities normal, atraumatic, no cyanosis or edema  Neurologic: Grossly normal    Labs:   CBC:   Recent Labs     11/10/22  0647 11/11/22  0743 11/12/22  0724   WBC 8.5 7.8 10.3   HEMOGLOBIN 13.5 13.4 13.3   HCT 39.6 39.7 39.6   MCV 93.1 93.5 92.8   PLT 194 237 327     BMP:   Recent Labs     11/10/22  0647 11/11/22  0743 11/12/22  0724   NA 139 140 144   K 3.9 3.6 3.5   CL 106 108* 110*   CO2 22 23 22    BUN 36* 34* 43*   CREATININE 1.13 1.00 1.04     LIVER PROFILE:   No results for input(s): "AST", "ALT", "LIPASE", "AMYLASE", "BILIDIR", "BILITOT", "ALKPHOS" in the last 72 hours.    PT/INR:  No results found for: "PROTIME", "INR"  PTT:  No results found for: "APTT"  UA: No results for input(s): "NITRITE", "COLORU", "PHUR", "MUCUS", "TRICHOMONAS", "YEAST", "BACTERIA", "SPECGRAV", "UROBILINOGEN", "GLUCOSEU", "KETONESU", "AMORPHOUS" in the last 72 hours.    Invalid input(s): "LABCAST", "WBCUA", "RBCUA", "CLARITYU", "LEUKOCYTESUR", "BILIRUBINUR", "BLOODU"    Imaging:  No results  found.    Assessment:  Principal Problem:    COVID-19      Plan:    COVID Infection.  Tested positive : 11/06/22  Admitted  to [place] isolation unit  Placed on Vitamin C, Vitamin D, & Zinc   Pulmonary  consulted  Hypertensive urgency  Continue  norvasc.  Acute Metabolic Encephalopathy  Agitated, treated with haldol  Dementia  HTN  Continue  norvasc.  Continue metoprolol.  Fall at home  Diarrhea  Hypokalemia.  K was replaced..  Hypomagnesemia.  Magnesium was replaced.  Advanced age  DVT prophylaxis.  Patient was placed on lovenox subcutaneous bid.  DC Planning.  PT/OT/SS to see for the d/c planning.  To ECF  Code status - DNRCCA/DNI    High risk for clinical decline    Electronic Signature:  Hilbert Corrigan, MD 11/12/2022 8:59 AM     Due to the current situation with the COVID - 19 pandemic, to minimize the exposure, the chart  in Epic was reviewed. Based on the chart review, discussion with nursing staff & pulmonary service medical decision was made.        Electronically signed by Wynona Luna, MD at 11/12/2022 10:17 AM EST

## 2022-11-12 NOTE — Care Plan (Signed)
Formatting of this note might be different from the original.  End of Shift and Plan of Care Summary        No changes overnight.     Goals per Patient Condition   Fall Prevention Plan: Patient will remain free from falls. See the Daily cares/safety flowsheet for intervention documentation.  Skin Integrity Plan: Patient skin integrity maintained. See Integumentary flowsheet for intervention documentation.  Isolation Precautions in Use: Isolation precautions in place. See Daily cares/safety flowsheet for intervention documentation.  DVT Prophylaxis in Use: Patient will remain free from DVT. See Daily cares/safety flowsheet for intervention documentation.  Pain Management Plan: Patient is being monitored for pain and response to treatment. See Vital Signs Complex Assessment flowsheet and MAR for intervention documentation.      Goals/Plan for shift  Patient/Family stated goal for shift: maintain comfort  Nursing goal for shift: maintain safety, comfort, promote hydration,remain Afebrile, remain free from falls  Plan: Monitor I&O. ENcourage PO fluids. Monitor temp. administer meds as ordered, fall precautions    Goals/Plan for Hospital Stay  Patient/Family stated goal for hospital stay: maintain comfort  Nursing goal for hospital stay: maintain safety, comfort, promote hydration,remain Afebrile, remain free from falls  Plan Monitor I&O. ENcourage PO fluids. Monitor temp. administer meds as ordered, fall precautions    Electronically signed by Milinda Antis, RN at 11/12/2022  5:57 AM EST

## 2022-11-12 NOTE — Progress Notes (Signed)
Formatting of this note is different from the original.   Occupational Therapy Treatment    Admit date:  11/06/2022          Today's Date: 11/12/2022      Patient Name/MR#:  Janice Cunningham     [id number]   Current Room:  F5540/F5540-A   Admitting Diagnosis: Weakness generalized [R53.1]  COVID-19 [U07.1]    Admitting Provider: Ranjit Katneni, MD    Past Medical/Surgical History:   has a past medical history of COVID-19 virus infection (11/06/2022), Dementia (HCC), and ESBL (extended spectrum beta-lactamase) producing bacteria infection (02/26/2021).  has a past surgical history that includes Hysterectomy.      OT Discharge Recommendations  1. Occupational Therapy services recommended to continue after discharge: Yes  2. Supervision for safety required: Full time supervision required: Unobserved for brief periods of time only but not left alone  3. Physical assistance required for daily care: Total Physical Assistance required: Caregiver supplies 76-100% of physical assistance  4. OT Discharge Equipment Needs If Returning Home: Other (Comment) (defer to next level of care)      Functional Outcomes/Testing and Reporting   Daily Activity AM-PAC 6 Click:  8    General  - Subjective: Pt minimally speaking and very soft spoken.    - Current Medical Status: Janice Cunningham is a 88 y.o. female who presents to the Emergency Department for evaluation of lethargy and increased confusion.  Family reports that the patient ate very little yesterday and was not able to get up and move around and slept most of the day.  Pt positive for COVID. Pt was DC'd home and experienced fall. Returns to ED and admitted.    - Testing/Imaging: CT-ANGIO HEAD&NECK   IMPRESSION: 1.  No aneurysms, vascular occlusions, or intracranial stenoses identified. 2.  No significant stenosis in the extracranial vertebral or internal carotid arteries. 50% stenosis of left external carotid artery origin. XR-WRIST LEFT 2 VIEWS   IMPRESSION: No acute osseous  abnormality. Mild to moderate degenerative joint disease. XR-FOREARM LEFT 2 VIEWS IMPRESSION: No acute osseous abnormality or significant degenerative joint disease.  XR-WRIST RIGHT 2 VIEWS  IMPRESSION: No acute osseous abnormality. Mild degenerative joint disease. XR-FOREARM RIGHT 2 VIEWS IMPRESSION: No acute osseous abnormality or significant degenerative joint disease.     XR-HIP BIL 3-4 VIEWS W/ PELVIS   IMPRESSION: No acute osseous abnormality. Mild hip joint space narrowing most consistent with degenerative osteoarthritis.    Home Environment  Source: Patient is unreliable historian (Pt with dementia at baseline and HOH, unable to get PLOF)          Premorbid Communication: Wears hearing aids, Hearing impaired, Wears glasses/contacts  Premorbid Cognition: Impaired    Weightbearing Status  Acute Weight Bearing Status: Full Weight Bearing (No restrictions ordered)    Therapy Precautions  Fall Risk    Session Beginning Location, Safety Info  Upon Arrival Patient Location: In bed   Upon Arrival Safety: Bed alarm on, Vitals stable  Pertinent Information Since Last Session: Other (comment) (Pt seen by speech today and placed on regular diet. Pt noted to have difficulty with initiating feeding, tongue brown/dry, and food in teeth (coughing when therapist helped with oral care). Spoke with RN and SLP about concerns.)     Pain Assessment  Pain Score: 0  Pain Location: N/A      Oxygen Administration Details    Oxygen Delivery Device: RA    Observations: No s/s of distress. Pt very hard of hearing and   requiring paper/marker to communicate. Pt shaking her hand stating she kinda felt "off" after transfer to chair. BP 128/60, HR in low 100s, and O2 sat 88-90%. Pt with nonproductive cough. Poor oral care and tongue noted to being dry/brown. Difficulty with feeding self. RN made aware.    Activity Tolerance  Functional Activity Tolerance: Tolerate 10-20 min activity with multiple rest breaks        Task Current Assist Level  Devices, Other Details   Grooming    Maximal assist    UB Dressing        LB Dressing        Bathing        Toileting        Bed Mobility    Total assist    Sit to/from Stand    Total assist (Max A x 2 for 3 standing trials)    FWW   Toilet Transfer              Goals  OT POC Due: 11/22/22  Feeding  GOAL: Will complete feeding with: [place]    Treatment This Date:    Feeding Status: Addressed During Today?s Session    Feeding Assistance Level: Minimal assist    Assistance Required for: Verbal cueing, Bringing food to mouth assist, Motor planning, Safety awareness/judgement    Patient Position: In chair/recliner    Grooming  GOAL: Will complete grooming/simple hygiene with: Distant supervision (Seated)    Treatment This Date:    Grooming Status: Addressed During Today?s Session    Grooming Assistance Level: Maximal assist    Assistance Required for: Verbal cueing, Increased time to complete, Grasp grooming supplies, Use of grooming supplies, Bringing hand to head, Safety awareness/judgement    Task(s) Completed: Pt assisted with brushing hair with max A. Total A for oral care.    Patient Position: In chair/recliner    UB Dressing  GOAL: Will complete upper body (UB) dressing with: Minimal assist    Treatment This Date:     UB Dressing Status: Not Addressed During Today?s Session            LB Dressing  GOAL: Will complete lower body (LB) dressing with: Maximal assist    Treatment This Date:    LB Dressing Status: Not Addressed During Today?s Session            Bathing  GOAL: Will complete bathing with: Maximal assist    Treatment This Date:     Bathing Status: Not Addressed During Today?s Session            Toileting  GOAL: Will complete toileting with: Maximal assist    Treatment This Date:     Toileting Status: Not Addressed During Today?s Session          Bed Mobility  GOAL: Will complete bed mobility with: Moderate assist    Treatment This Date:    Bed Mobility Status: Addressed During Today?s Session    Bed  Mobility Assist Level: Total assist    Assistance Required for: Supervision/safety, Increased time to complete, Verbal cues for technique, HOB elevated, Task completion, Motor planning    Task(s) Completed: Pt minimally moving and requiring total A x 2 for transfer. Min-max A for sitting balance d/t posterior leaning.    Sit to/from Stand  GOAL: Will perform sit to/from stand with: Moderate assist, Least restrictive assistive device    Treatment This Date:    Sit to from Stand Status: Addressed During Today?s Session      Sit to from Stand Assist Level: Total assist (Max A x 2 for 3 standing trials)    Assistive Device: FWW    Assistance Required for: Hand placement, Clearing buttocks from surface, Positioning/Alignment, Motor planning, Safety awareness/judgement, Sequencing, Controlled descent, Proper technique    Functional Mobility  GOAL: Patient will perform functional mobility task with: Moderate assist, Least restrictive assistive device    Treatment This Date:    Functional Mobility Status: Not Addressed During Today?s Session            Toilet Transfer  GOAL: Patient will perform toilet transfer: Moderate assist, Least restrictive assistive device    Treatment This Date:    Toilet Transfer Status: Not Addressed During Today?s Session            Bed to/from Chair  GOAL: Will perform bed to/from chair with: Moderate assist, Least restrictive assistive device    Treatment This Date:    Bed to/from Chair Status: Addressed During Today?s Session    Bed to/from Chair Assist Level: Total assist (Total A x 2 for SPT from bed > chair)    Assistance Required for: Verbal cueing, Increased time to complete, Safety awareness/judgement, Motor planning, Task completion    Assistive Device Needed: None (SPT without a.d. Pt not moving feet at all; PT in front while OT guiding hips)    Home Exercise Goal Status  GOAL: To complete HEP : With minimal verbal cues    Treatment This Date:    HEP Status: Not Addressed During Today?s  Session                Patient/Caregiver Education  Education Provided: Role of OT, Education about rehab diagnosis, Use of call light for assistance, Importance of mobility  Patient Education Completed: Requires continued education  Family/Caregiver Education Completed: No family/caregiver present    Session Ending Location, Safety Info  End of Session Patient Location: Sitting in chair (Pt staring off and minimally responding/participating. Therapist set pt up with her DVD player and Chana Bode DVD. RN made aware pt will need to be feed assist for all meals.)  End of Session Patient Safety: Chair alarm on, Gait belt used, Call light within reach, Lines and drains intact, Phone within reach   Interdisciplinary Communication: Spoke with RN prior to session, RN updated on patient performance, Spoke with PT    Plan  OT Patient/Family Goal: To go back home  Treatment Interventions to Address Currrent Goals: Self care/Home Management, Therapeutic activities, Therapeutic exercise   OT Frequency: 5x/wk  OT Duration of Treatment: 2 weeks  OT Co-Treat?: Yes  Reason for Cotreatment: Poor activity tolerance for participation in multiple treatment sessions, Requires > mod assist for progressive mobility out of bed activities    Treatment Time/Minutes  Treatment- Time Initiated: 0917  Time Treatment Completed: 1001  Total Treatment Time: 44  Timed Code Minutes: 44 Minutes  Reason for Cotreatment: Poor activity tolerance for participation in multiple treatment sessions, Requires > mod assist for progressive mobility out of bed activities    (2 TA, 1 ADL)  Electronically signed by Zena Amos, OTD, OTR/L at 11/12/2022 12:49 PM EST

## 2022-11-12 NOTE — Progress Notes (Signed)
Formatting of this note might be different from the original.                                                Discharge Planning Brief Note    Plan A: [facility] can accept pending out of network insurance approval    Plan B:+covid 11/24    Reason for Intervention: Care Mgt called insurance contact at [facility] to follow up on review.  Was informed that they did receive updated clinical informations as requested and is being  reviewed today by clinical team.   Electronically signed by Hewitt Blade, LISW at 11/12/2022  3:54 PM EST

## 2022-11-12 NOTE — Progress Notes (Signed)
Formatting of this note is different from the original.   Physical Therapy Treatment    Admit date:  11/06/2022          Today's Date: 11/12/2022      Patient Name/MR#:  Janice Cunningham     [id number]   Current Room:  F5540/F5540-A   Admitting Diagnosis: Weakness generalized [R53.1]  COVID-19 [U07.1]    Admitting Provider: Ranjit Katneni, MD       PT Discharge Recommendations  1. Physical Therapy services recommended to continue after discharge: Yes  2. Supervision for safety required: Caregiver will need to provide full time supervision: Unobserved for brief periods of time only but not left alone  3. Physical Assistance required for daily care: Caregiver will need to provide total physical assistance: Caregiver will need to supply 76-100% of physical assistance  4. PT Discharge Equipment Needs if Returning Home: Other (Comment)      PT Discharge Equipment Comments: continue to assess    Functional Outcomes/Testing and Reporting    Basic Mobility: AM-PAC 6 Clicks:7    General  - Subjective: "Jkayla"    - Current Medical Status: Janice Cunningham is a 88 y.o. female who presents to the Emergency Department for evaluation of lethargy and increased confusion.  Family reports that the patient ate very little yesterday and was not able to get up and move around and slept most of the day.  Pt positive for COVID. Pt was DC'd home and experienced fall. Returns to ED and admitted.    - Testing/Imaging: CT-ANGIO HEAD&NECK   IMPRESSION: 1.  No aneurysms, vascular occlusions, or intracranial stenoses identified. 2.  No significant stenosis in the extracranial vertebral or internal carotid arteries. 50% stenosis of left external carotid artery origin. XR-WRIST LEFT 2 VIEWS   IMPRESSION: No acute osseous abnormality. Mild to moderate degenerative joint disease. XR-FOREARM LEFT 2 VIEWS IMPRESSION: No acute osseous abnormality or significant degenerative joint disease.  XR-WRIST RIGHT 2 VIEWS  IMPRESSION: No acute osseous abnormality. Mild  degenerative joint disease. XR-FOREARM RIGHT 2 VIEWS IMPRESSION: No acute osseous abnormality or significant degenerative joint disease.     XR-HIP BIL 3-4 VIEWS W/ PELVIS   IMPRESSION: No acute osseous abnormality. Mild hip joint space narrowing most consistent with degenerative osteoarthritis.    Home Environment  Source: Patient is unreliable historian (Pt with dementia at baseline and HOH, unable to get PLOF)                  Home Equipment       Prior Functional Level  Functional Transfers: Other (comment) (unreliable historian. unable to provide subjective information.)  Functional Mobility: Other (comment) (unreliable historian. unable to provide subjective information.)    Premorbid Communication: Hearing impaired, Wears hearing aids  Premorbid Cognition: Impaired (per chart, pt with dementia and slight confusion at baseline.)    Weightbearing Status   (n/a)    Therapy Precautions  Fall Risk  Additional Comments:     Orthotics/Braces  Orthotics, Braces: Not applicable    Session Beginning Location, Safety Info  Upon Arrival Patient Location: In bed  Upon Arrival Safety: Bed alarm on  Pertinent Information Since Last Session: No acute changes since last session    Pain Assessment  Pain Score: Patient unable to rate but no noted signs of pain        Oxygen Administration Details    Oxygen Delivery Device: RA      Observations: no s/s of distress. following t/f to chair, pt   consistently satting 89-90% throughout end of session. RN made aware of pt's SpO2. attempted to communicate through written instructions as opposed to verbal - minimally effective.    Activity Tolerance  Functional Activity Tolerance: Tolerate 10-20 min activity with multiple rest breaks        Task Current Assist Level Devices, Other Details   Bed Mobility      Rolling Right Left      Supine to Sit Total assist (total x2)    Sit to Supine      Sit to/from Stand Total assist (Max A x2. performs 3 stands from EOB to FWW. pt with strong  posterir lean. despite tactile cues for hip extension and upright posture. performs one stand w/out A.D. Max A x2.) On Evaluation    Subsequent Treatment    Transfer       Bed to Chair Total assist (pt unable to takes steps to complete t/f with FWW. for improved safety, performed t/f without A.D, total A x2.)     Gait Training      On Evaluation      Subsequent Treatment    Distance:  On Evaluation: 4 feet   Subsequent Treatment:     Stair Training      On Evaluation:    Subsequent Treatment:       Supine To Sit    GOAL: Will perform supine to sit with: Minimal assist      Treatment This Date:     Supine to Sit Status: Addressed during today's session    Supine to Sit Assistance: Total assist (total x2)    Assistance Required for: Bilateral LEs, Trunk, Physical assist for scooting hips to edge of bed    Therapeutic Modifications During Session: HOB elevated    Sit to Supine    GOAL: Will perform sit to supine with: Minimal assist      Treatment This Date:    Sit to Supine Status: Not Addressed during today?s session          Sit to/from Stand    GOAL: Will perform sit to/from stand with: Minimal assist, Least restrictive device      Treatment This Date:     Sit to/From Stand Status: Addressed during today's session    Sit to Stand Assistance: Total assist (Max A x2. performs 3 stands from EOB to FWW. pt with strong posterir lean. despite tactile cues for hip extension and upright posture. performs one stand w/out A.D. Max A x2.)      Assistance Required for: Clearing buttocks from surface, Proper technique    Therapeutic Modifications Required for: Bed height elevated    Bed to Chair    GOAL: Will perform sit to/from chair with: Minimal assist, Least restrictive assistive device      Treatment This Date:     Bed to Chair: Addressed during today's session    Bed to Chair Assistance: Total assist (pt unable to takes steps to complete t/f with FWW. for improved safety, performed t/f without A.D, total A x2.)     Assistive Devices: No device    Assistance Required for: Safety, Controlled descent    Therapeutic Modifications Required for: Pivot     Gait Training    GOAL: Will ambulate: 100-150 feet, Minimal assist, Least restrictive assistive device      Treatment This Date:    Gait Training Status: Not Addressed during today?s session              Balance      GOAL: Will participate in: Static, Dynamic, For 5 minutes, with minimal assist (LRAD)    Balance - Standing    Treatment This Date:    Standing Balance Status: Not Addressed during today?s session, Addressed during today's session    Standing Patient Position: FWW    Standing Level of Assist: Total assist (max A x2)    Standing Balance Support Assistance Required for: Physical assist for correcting posterior lean      Standing Tolerance Duration: 15 seconds x3    Balance - Sitting    Treatment This Date:     Sitting Balance Status: Addressed during today's session    Sitting Patient Position: Feet supported, Edge of bed, BUE support    Sitting Level of Assist: Minimal assist    Sitting Balance Support Assistance Required for: Physical assist for correcting posterior lean      Sitting Tolerance Duration: 5 min    Home Exercise    GOAL: Patient to complete HEP: Seated, Standing, With least restrictive assistive device, With minimal verbal cues      Treatment This Date:    HEP Goal Status: Not Addressed During Today?s Session                POC DUE  11/22/22    Patient/Caregiver Education  Education Provided: Role of PT, Use of call light for assistance, Importance of mobility  Patient Education Completed: Requires continued Programmer, systems Education Completed: No family/caregiver present    Session Ending Location, Safety Info  End of Session Patient Location: Sitting in chair  End of Session Patient Safety: Chair alarm on, Gait belt used, Call light within reach, Lines and drains intact, Phone within reach  Interdisciplinary Communication: Spoke with RN prior  to session, RN notified about issues during session, RN updated on patient performance, Spoke with OT    Plan  PT Patient/Family Goal: unable to state goal at time of evaluation  Treatment: Therapeutic activities, Therapeutic exercise, Gait training, Neuromuscular re-education, Self-care  PT Frequency: 5x/wk  PT Duration of Treatment: 2 weeks  PT Co-Treat: Yes  Reason for Cotreatment: Poor activity tolerance for participation in multiple treatment sessions, Requires > mod assist for progressive mobility out of bed activities    Treatment Time/Minutes  Time Treatment Initiated: 0917  Time Treament Completed: 1001  Total Treatment Time: 44  Timed Code Minutes: 44 Minutes        Electronically signed by Loman Brooklyn, PT at 11/12/2022 12:45 PM EST

## 2022-11-12 NOTE — Progress Notes (Signed)
Formatting of this note is different from the original.  Palliative Care Progress Note    Name: Janice Cunningham      Room/Bed: N5621/H0865-H  DOB: 1926/01/10         88 y.o.    11/12/2022   HCPOA/Family contact: [name] (daughter) [phone]   Code Status: DNR CCA    Impression/Plan     Patient was not physically assessed due to being in isolation for COVID-19.  Writer did collaborate with staff and thoroughly reviewed patient's chart.    Per staff patient remains awake and confused.    Patient is currently on room air with saturation 90%.  Staff reports patient is congested today.  Vitals are stable.  Most recent BP 143/63, receiving an hypertensives. Patient originally presented with weakness.  Patient found to be COVID-positive.  Patient is receiving vitamin D, C and zinc.  WBC count 10.3.  Pulmonology following.  Patient with poor intake.  Patient on regular diet.  Albumin 3.8.  Patient is weak, PT/OT services in place.  Staff reports patient denies pain at this time.    Patient does not have living will or healthcare power of attorney paperwork on file.  Per her daughters patient's daughter [name] is her Education officer, community.  Discussion Monday with patient daughter [place] related to goals of care and current medical health status, verbalized understanding.  Patient current DNR CCA, no intubation CODE STATUS.  Paperwork on file.    Patient comes from private residence where she lives alone. Recommend skilled services following hospital discharge.  Family voiced that they would like patient to discharge to [facility].  Recommend palliative services following hospital discharge, will follow up.  Writer to continue discussions related to goals of care.  LSW aware.    1.  Continue DNR CCA, no intubation CODE STATUS  2.  Agree with current plan of care  3.  Continue isolation precautions  4.  Continue PT/OT services  5.  Recommend skilled services following hospital discharge  6.  Recommend  palliative services following hospital discharge, will follow up  7.  Continue pastoral services for supportive care  8.  Writer to continue discussions related to goals of care  9.  Palliative care services will continue to follow    Objective:      Vital Signs:BP: 143/63 (11/30 1016)  Temp: 97.9 F (36.6 C) (11/30 1016)  Pulse: 94 (11/30 1016)  Resp: 16 (11/30 1016)  SpO2: 90 % (11/30 1016)  FiO2 (%): --  O2 Flow Rate (L/min): 0 L/min (11/30 0114)  Cardiac (WDL): --  Cardiac Rhythm: --  Last Filed Weights    11/06/22 2248 11/12/22 0406   Weight: 112 lb 7 oz (51 kg) 108 lb 7.5 oz (49.2 kg)    Body mass index is 19.21 kg/m. Temp (48hrs), Avg:98.1 F (36.7 C), Min:97.6 F (36.4 C), Max:99.4 F (37.4 C)      Dementia: yes  Delirium: no  Coma: no  Fall Risk: yes      PPS: 40%    Labs:   Recent Labs     11/10/22  0647 11/11/22  0743 11/12/22  0724   WBC 8.5 7.8 10.3   HEMOGLOBIN 13.5 13.4 13.3   HCT 39.6 39.7 39.6   PLT 194 237 327   NA 139 140 144   K 3.9 3.6 3.5   CL 106 108* 110*   CO2 22 23 22    BUN 36* 34* 43*   CREATININE 1.13  1.00 1.04   GLU 97 106 118*   MG 2.0 2.0 2.1     MEDS:   Current Facility-Administered Medications:     acetaminophen (TYLENOL) tablet 500-1,000 mg, 500-1,000 mg, Oral, EVERY 8 HOURS PRN, 1,000 mg at 11/07/22 2318 **OR** acetaminophen (TYLENOL) tablet 650 mg, 650 mg, Oral, EVERY 6 HOURS PRN, Keri Nicole Hollencamp, APRN-CNP, 650 mg at 11/10/22 2018    amLODIPine (NORVASC) tablet 10 mg, 10 mg, Oral, DAILY, Sivani Kristeen Miss, MD, 10 mg at 11/12/22 1019    ascorbic acid (vitamin C) (VITAMIN C) tablet 500 mg, 500 mg, Oral, DAILY, Sivani Kristeen Miss, MD, 500 mg at 11/12/22 1019    benzonatate (TESSALON) capsule 200 mg, 200 mg, Oral, EVERY 8 HOURS PRN, Keri Nicole Hollencamp, APRN-CNP, 200 mg at 11/11/22 2019    chlorhexidine gluconate 2 % cloth, , Topical, DAILY AT 12 NOON, Keri Nicole Hollencamp, APRN-CNP, Given at 11/11/22 1249    cholecalciferol (VITAMIN D3) capsule 5,000  Units, 5,000 Units, Oral, DAILY, Sivani Kristeen Miss, MD, 5,000 Units at 11/11/22 1000    docusate (COLACE) capsule 100 mg, 100 mg, Oral, TWO TIMES DAILY PRN, Keri Nicole Hollencamp, APRN-CNP    enoxaparin (LOVENOX) injection 30 mg, 30 mg, Subcutaneous, TWO TIMES DAILY, Keri Nicole Hollencamp, APRN-CNP, 30 mg at 11/12/22 1030    guaiFENesin (MUCINEX) extended-release tablet 600 mg, 600 mg, Oral, 2 times per day, Keri Nicole Hollencamp, APRN-CNP, 600 mg at 11/12/22 1019    metoprolol tartrate (LOPRESSOR) tablet 25 mg, 25 mg, Oral, TWO TIMES DAILY, Sivani Kristeen Miss, MD, 25 mg at 11/12/22 1019    ondansetron (ZOFRAN) injection 4 mg, 4 mg, Intravenous, EVERY 4 HOURS PRN **OR** ondansetron (ZOFRAN-ODT) disintegrating tablet 4 mg, 4 mg, Oral, EVERY 4 HOURS PRN, Keri Nicole Hollencamp, APRN-CNP    potassium chloride (KLOR-CON M) ER tablet 40 mEq, 40 mEq, Oral, ONCE, Sivani Siva Pathmarajah, MD    zinc sulfate (ZINCATE) capsule 220 mg, 220 mg, Oral, DAILY, Sivani Kristeen Miss, MD, 220 mg at 11/12/22 1019     MEDSPRN: acetaminophen **OR** acetaminophen, benzonatate, docusate, ondansetron **OR** ondansetron    MEDSINFUSIONS:         Imaging Studies: Reviewed in chart    Spiritual assessment     Seen per chaplain yes  Spiritual needs yes    Greater than 50% of time during encounter was spent counseling and/or coordination of care as documented above.    Electronically Signed:  Lisabeth Register, APRN-CNP 11/12/2022      Electronically signed by Gerrit Halls, DO at 11/12/2022  3:00 PM EST

## 2022-11-12 NOTE — Progress Notes (Signed)
Formatting of this note is different from the original.   Occupational Therapy Treatment    Admit date:  11/06/2022          Today's Date: 11/12/2022      Patient Name/MR#:  Janice Cunningham     [id number]   Current Room:  H6314/H7026-V   Admitting Diagnosis: Weakness generalized [R53.1]  COVID-19 [U07.1]    Admitting Provider: Clarisse Gouge, MD    Past Medical/Surgical History:   has a past medical history of COVID-19 virus infection (11/06/2022), Dementia (HCC), and ESBL (extended spectrum beta-lactamase) producing bacteria infection (02/26/2021).  has a past surgical history that includes Hysterectomy.      OT Discharge Recommendations  1. Occupational Therapy services recommended to continue after discharge: Yes  2. Supervision for safety required: Full time supervision required: Unobserved for brief periods of time only but not left alone  3. Physical assistance required for daily care: Total Physical Assistance required: Caregiver supplies 76-100% of physical assistance  4. OT Discharge Equipment Needs If Returning Home: Other (Comment) (defer to next level of care)      Functional Outcomes/Testing and Reporting   Daily Activity AM-PAC 6 Click:  8    General  - Subjective: Pt minimally speaking and very soft spoken.    - Current Medical Status: Janice Cunningham is a 88 y.o. female who presents to the Emergency Department for evaluation of lethargy and increased confusion.  Family reports that the patient ate very little yesterday and was not able to get up and move around and slept most of the day.  Pt positive for COVID. Pt was DC'd home and experienced fall. Returns to ED and admitted.    - Testing/Imaging: CT-ANGIO HEAD&NECK   IMPRESSION: 1.  No aneurysms, vascular occlusions, or intracranial stenoses identified. 2.  No significant stenosis in the extracranial vertebral or internal carotid arteries. 50% stenosis of left external carotid artery origin. XR-WRIST LEFT 2 VIEWS   IMPRESSION: No acute osseous  abnormality. Mild to moderate degenerative joint disease. XR-FOREARM LEFT 2 VIEWS IMPRESSION: No acute osseous abnormality or significant degenerative joint disease.  XR-WRIST RIGHT 2 VIEWS  IMPRESSION: No acute osseous abnormality. Mild degenerative joint disease. XR-FOREARM RIGHT 2 VIEWS IMPRESSION: No acute osseous abnormality or significant degenerative joint disease.     XR-HIP BIL 3-4 VIEWS W/ PELVIS   IMPRESSION: No acute osseous abnormality. Mild hip joint space narrowing most consistent with degenerative osteoarthritis.    Home Environment  Source: Patient is unreliable historian (Pt with dementia at baseline and HOH, unable to get PLOF)          Premorbid Communication: Wears hearing aids, Hearing impaired, Wears glasses/contacts  Premorbid Cognition: Impaired    Weightbearing Status  Acute Weight Bearing Status: Full Weight Bearing (No restrictions ordered)    Therapy Precautions  Fall Risk    Session Beginning Location, Safety Info  Upon Arrival Patient Location: In bed   Upon Arrival Safety: Bed alarm on, Vitals stable  Pertinent Information Since Last Session: Other (comment) (Pt seen by speech today and placed on regular diet. Pt noted to have difficulty with initiating feeding, tongue brown/dry, and food in teeth (coughing when therapist helped with oral care). Spoke with RN and SLP about concerns.)     Pain Assessment  Pain Score: 0  Pain Location: N/A      Oxygen Administration Details    Oxygen Delivery Device: RA    Observations: No s/s of distress. Pt very hard of hearing and  requiring paper/marker to communicate. Pt shaking her hand stating she kinda felt "off" after transfer to chair. BP 128/60, HR in low 100s, and O2 sat 88-90%. Pt with nonproductive cough. Poor oral care and tongue noted to being dry/brown. Difficulty with feeding self. RN made aware.    Activity Tolerance  Functional Activity Tolerance: Tolerate 10-20 min activity with multiple rest breaks        Task Current Assist Level  Devices, Other Details   Grooming    Maximal assist    UB Dressing        LB Dressing        Bathing        Toileting        Bed Mobility    Total assist    Sit to/from Stand    Total assist (Max A x 2 for 3 standing trials)    FWW   Toilet Transfer              Goals  OT POC Due: 11/22/22  Feeding  GOAL: Will complete feeding with: Independence    Treatment This Date:    Feeding Status: Addressed During Today?s Session    Feeding Assistance Level: Minimal assist    Assistance Required for: Verbal cueing, Bringing food to mouth assist, Motor planning, Safety awareness/judgement    Patient Position: In chair/recliner    Grooming  GOAL: Will complete grooming/simple hygiene with: Distant supervision (Seated)    Treatment This Date:    Grooming Status: Addressed During Today?s Session    Grooming Assistance Level: Maximal assist    Assistance Required for: Verbal cueing, Increased time to complete, Grasp grooming supplies, Use of grooming supplies, Bringing hand to head, Safety awareness/judgement    Task(s) Completed: Pt assisted with brushing hair with max A. Total A for oral care.    Patient Position: In chair/recliner    UB Dressing  GOAL: Will complete upper body (UB) dressing with: Minimal assist    Treatment This Date:     UB Dressing Status: Not Addressed During Today?s Session            LB Dressing  GOAL: Will complete lower body (LB) dressing with: Maximal assist    Treatment This Date:    LB Dressing Status: Not Addressed During Today?s Session            Bathing  GOAL: Will complete bathing with: Maximal assist    Treatment This Date:     Bathing Status: Not Addressed During Today?s Session            Toileting  GOAL: Will complete toileting with: Maximal assist    Treatment This Date:     Toileting Status: Not Addressed During Today?s Session          Bed Mobility  GOAL: Will complete bed mobility with: Moderate assist    Treatment This Date:    Bed Mobility Status: Addressed During Today?s Session    Bed  Mobility Assist Level: Total assist    Assistance Required for: Supervision/safety, Increased time to complete, Verbal cues for technique, HOB elevated, Task completion, Motor planning    Task(s) Completed: Pt minimally moving and requiring total A x 2 for transfer. Min-max A for sitting balance d/t posterior leaning.    Sit to/from Stand  GOAL: Will perform sit to/from stand with: Moderate assist, Least restrictive assistive device    Treatment This Date:    Sit to from Stand Status: Addressed During Today?s Session  Sit to from Stand Assist Level: Total assist (Max A x 2 for 3 standing trials)    Assistive Device: FWW    Assistance Required for: Hand placement, Clearing buttocks from surface, Positioning/Alignment, Motor planning, Safety awareness/judgement, Sequencing, Controlled descent, Proper technique    Functional Mobility  GOAL: Patient will perform functional mobility task with: Moderate assist, Least restrictive assistive device    Treatment This Date:    Functional Mobility Status: Not Addressed During Today?s Session            Toilet Transfer  GOAL: Patient will perform toilet transfer: Moderate assist, Least restrictive assistive device    Treatment This Date:    Toilet Transfer Status: Not Addressed During Today?s Session            Bed to/from Chair  GOAL: Will perform bed to/from chair with: Moderate assist, Least restrictive assistive device    Treatment This Date:    Bed to/from Chair Status: Addressed During Today?s Session    Bed to/from Chair Assist Level: Total assist (Total A x 2 for SPT from bed > chair)    Assistance Required for: Verbal cueing, Increased time to complete, Safety awareness/judgement, Motor planning, Task completion    Assistive Device Needed: None (SPT without a.d. Pt not moving feet at all; PT in front while OT guiding hips)    Home Exercise Goal Status  GOAL: To complete HEP : With minimal verbal cues    Treatment This Date:    HEP Status: Not Addressed During Today?s  Session                Patient/Caregiver Education  Education Provided: Role of OT, Education about rehab diagnosis, Use of call light for assistance, Importance of mobility  Patient Education Completed: Requires continued education  Family/Caregiver Education Completed: No family/caregiver present    Session Ending Location, Safety Info  End of Session Patient Location: Sitting in chair (Pt staring off and minimally responding/participating. Therapist set pt up with her DVD player and Chana Bode DVD. RN made aware pt will need to be feed assist for all meals.)  End of Session Patient Safety: Chair alarm on, Gait belt used, Call light within reach, Lines and drains intact, Phone within reach   Interdisciplinary Communication: Spoke with RN prior to session, RN updated on patient performance, Spoke with PT    Plan  OT Patient/Family Goal: To go back home  Treatment Interventions to Address Currrent Goals: Self care/Home Management, Therapeutic activities, Therapeutic exercise   OT Frequency: 5x/wk  OT Duration of Treatment: 2 weeks  OT Co-Treat?: Yes  Reason for Cotreatment: Poor activity tolerance for participation in multiple treatment sessions, Requires > mod assist for progressive mobility out of bed activities    Treatment Time/Minutes  Treatment- Time Initiated: 0917  Time Treatment Completed: 1001  Total Treatment Time: 44  Timed Code Minutes: 44 Minutes  Reason for Cotreatment: Poor activity tolerance for participation in multiple treatment sessions, Requires > mod assist for progressive mobility out of bed activities    (2 TA, 1 ADL)  Electronically signed by Zena Amos, OTD, OTR/L at 11/12/2022 12:49 PM EST

## 2022-11-13 NOTE — Progress Notes (Signed)
Formatting of this note is different from the original.  Images from the original note were not included.       Dysphagia Treatment Note    Recommendations: Recommend downgrade to Level I/puree diet consistency, continue Thin liquids - straws ok with use of straw pinch to facilitate single sips. Medication crushed with puree and 1:1 assist to feed. Frequently check oral cavity to ensure no pocketing of items. ST will continue to follow.     Admit date:  11/06/2022          Today's Date: 11/13/2022      Patient Name/MR#:  Janice Cunningham     [id number]   Current Room:  B4496/P5916-B   Admitting Diagnosis: Weakness generalized [R53.1]  COVID-19 [U07.1]    Admitting Provider: Clarisse Gouge, MD   Past Medical/Surgical History:   has a past medical history of COVID-19 virus infection (11/06/2022), Dementia (HCC), and ESBL (extended spectrum beta-lactamase) producing bacteria infection (02/26/2021).  has a past surgical history that includes Hysterectomy.      Recommend continued dysphagia therapy upon discharge: Yes    General  - Subjective: N/A    - Current Medical Status: Janice Cunningham is a 88 y.o. female who presents to the Emergency Department for evaluation of lethargy and increased confusion.  Family reports that the patient ate very little yesterday and was not able to get up and move around and slept most of the day.  Pt positive for COVID. Pt was DC'd home and experienced fall. Returns to ED and admitted.     - Testing/Imaging:     CXR 11/13/22:   Read pending     - Past therapy for current diagnosis, illness or injury?: Unknown (Patient seen this date for swallow evaluation. Unsure of prior difficulty swallowing - Reg diet/Thin liquids during this admission. No prior ST in Epic review.)      - Prior Instrumental Testing: No      - Premorbid Communication: Unable to determine (Pt extremely HOH)    - Adaptive Devices Used      Hearing Aids: Left in patient's  ear(s)      - Patient Safety/Staff Communication     Upon  Arrival Safety: Staff present, Bed alarm on     End of Session Patient Safety: Bed in lowest position, Bed alarm on     Interdisciplinary Communication: RN updated, White board updated    - Pertinent Information Since Last Session     Pertinent Information Since Last Session: Call recieved from RN - pt with limited acceptance of PO - noted to be pocketing/holding food and pills.    Pain Assessment  Pain Score: Patient unable to rate but no noted signs of pain        Cognition  Mental Status: Alert, Confused  Follows Commands: 1 step, Inconsistently  Orientation: Person  Barriers to Learning: Hearing, Cognition, Judgement/insight    Diet Information  Diet Prior to Admission: Unknown  Current Diet this Admission: Thin liquids, No texture restrictions/regular    Respiratory Status    Oxygen    Oxygen Delivery Device: Nasal cannula    O2 Flow Rate (L/min): 2 L/min    Secretion Management: Xersotomia    Secretion Management  Secretion Management: Other (comment) (Decreased - pt noted with leftover breakfast items in mouth. Oral swabbed/cleared by SLP)    Breath Sounds (documented per chart review)   Standard Breath Sounds  Respiratory Assessment (WDL): Exception to [facility]  All Chest Area Breath Sounds:  Anterior Chest  Left Upper Chest Breath Sounds: Clear  Left Lower Chest Breath Sounds: Diminished  Right Upper Chest Breath Sounds: Exp Wheezes, Diminished  Right Middle Chest Breath Sounds: Diminished  Right Lower Chest Breath Sounds: Diminished  Respiratory Pattern: Eupneic  Chest Assessment: Chest Expansion Symmetrical    Today's Treatment Session/Response to Treatment    Trial Presentation Vol Presented Oral Pharyngeal Comments   1 Ice   Expectorated      2 Thin, Straw 3oz  (Decreased control, mild discoordinated a-p transit) Suspected delayed swallow, Coughing-inconsistent  Delayed cough with larger drinks; no overt s/s of aspiration with use of straw pinch by SLP   3 Puree    (Mlid decreased formation - verbal cues  intermittent required to clear) Suspected delayed swallow, Decreased laryngeal elevation palpation (No overt s/s of aspiration)      Swallow Strategies Trialed  Liquid rinse, Verbal cues    Other Therapeutic Interventions Provided   Patient seen this date for swallow treatment. Call received from RN - pt with limited acceptance of PO/medication - RN concerned that he was unable to give patient's medication. Pt was also noted to be pocketing/holding breakfast items. SLP completed oral care - noted leftover items from breakfast remained in mouth. Cleared with swabbing. See above for PO trials given this date.     Patient/Caregiver Education  Education Provided: Role of SLP, Plan of Care  Patient Education Completed: Requires continued education  Family/Caregiver Education Completed: No family/caregiver present    Assessment Summary/Severity  - Diagnosis, Potential, Aspiration Risk    Swallow- Rehab Diagnosis: Mild oral dysphagia, Moderate oral dysphagia, Mild pharyngeal dysphagia    Swallow- Rehab Potential: Fair    Swallow - Risk of Aspiration/Complications: Low    - Recommendations    Liquid Recommendations: Thin    Texture Recommendations: Puree    Medication Administration Recommendations: In Puree Crushed as Able      Recommended Strategies: Small bites/sips, Alternate bites/sips, Upright positioning, Upright 30 minutes after meal (Check oral cavity for pocketing)    - Supervision/Additional Comments    Supervision/Assist with Oral Intake: Strict 1:1 supervision to enforce strategies    Assessment Summary Comments: Mild-moderate oral dysphagia, mild pharyngeal dysphagia. Immediate coughing with large drinks of Thin liquids via straw. No overt s/s of aspiration when SLP used straw pinch to facilitate single sips. Recommend downgrade to Level I/puree diet consistency, continue Thin liquids - straws ok with use of straw pinch to facilitate single sips. Medication crushed with puree and 1:1 assist to feed. Frequently  check oral cavity to ensure no pocketing of items. ST will continue to follow.     Plan  Patient/Family Goal: None stated  Swallow- Treatment Procedures/Intervention to Address Current Goals: Bedside Treatment  Swallow Frequency: 4  Swallow- Duration: 2 weeks  Treat Week Start Dt (Update Weekly or w/ Frequency Change): 11/12/22  Expected Visits This Week (Calculated): 4  Recommend Continued Dysphagia Therapy on Discharge: Yes    Goals  LTG:  Pt will demonstrate tolerance of least restrictive diet level for duration of hospitalization.    STG:  Pt will safely tolerate Regular diet without complications of dysphagia with >90% of PO intake. - Deferred 11/13/22  Pt will safely tolerate Thin liquids without s/s of aspiration risk with >90% of PO intake.  Pt will demonstrate ability to employ safe swallow strategies with min-mod assist.   Pt will participate in VFSS as appropriate    New STGs 11/13/22:   Pt will safely tolerate  Level I/puree diet without complications of dysphagia with >90% of PO intake.  Pt will safely tolerate trials of Level II/mech soft diet consistency without complications of dysphagia in >90% of PO intake.    POC DUE  Dysphagia POC Due: 11/26/22    Treatment/Minutes  Swallow -Time Treatment Initiated: 5188  Swallow -Treatment Time Out: 4166  [id number] Total Treatment Time: 15      Electronically signed by Charm Barges, SLP at 11/13/2022 12:30 PM EST

## 2022-11-13 NOTE — Progress Notes (Signed)
Formatting of this note is different from the original.  Hospitalist Progress Note    Patient Name:  Janice Cunningham     Date:  11/13/2022    Admitted complain:  SOB    Active Problem/s:  COVID infection    Subjective:  Pt was seen in the room  There is a rise in WBC. BUN is still high despite hydration  Low K noted.  She is on RA  Awaiting to be d/cd to ECF.  Pt is in the COVID isolation unit. To minimize the exposure,pt was examined with some limitations.  Objective:  Physical Exam:    BP (!) 114/55   Pulse 86   Temp 98.7 F (37.1 C)   Resp 16   Ht 5\' 3"  (1.6 m)   Wt 109 lb 2 oz (49.5 kg)   SpO2 (!) 91%   BMI 19.33 kg/m   BP (!) 114/55   Pulse 86   Temp 98.7 F (37.1 C)   Resp 16   Ht 5\' 3"  (1.6 m)   Wt 109 lb 2 oz (49.5 kg)   SpO2 (!) 91%   BMI 19.33 kg/m   General appearance: alert.  Lungs: clear to auscultation bilaterally  Heart: regular rate and rhythm, S1, S2 normal, no murmur, click, rub or gallop  Abdomen: soft, non-tender; bowel sounds normal; no masses,  no organomegaly  Extremities: extremities normal, atraumatic, no cyanosis or edema  Neurologic: Grossly normal    Labs:   CBC:   Recent Labs     11/11/22  0743 11/12/22  0724 11/13/22  0535   WBC 7.8 10.3 15.1*   HEMOGLOBIN 13.4 13.3 13.7   HCT 39.7 39.6 40.7   MCV 93.5 92.8 92.3   PLT 237 327 382     BMP:   Recent Labs     11/11/22  0743 11/12/22  0724 11/13/22  0535   NA 140 144 146*   K 3.6 3.5 3.2*   CL 108* 110* 114*   CO2 23 22 20*   BUN 34* 43* 49*   CREATININE 1.00 1.04 1.12     LIVER PROFILE:   No results for input(s): "AST", "ALT", "LIPASE", "AMYLASE", "BILIDIR", "BILITOT", "ALKPHOS" in the last 72 hours.    PT/INR:  No results found for: "PROTIME", "INR"  PTT:  No results found for: "APTT"  UA: No results for input(s): "NITRITE", "COLORU", "PHUR", "MUCUS", "TRICHOMONAS", "YEAST", "BACTERIA", "SPECGRAV", "UROBILINOGEN", "GLUCOSEU", "KETONESU", "AMORPHOUS" in the last 72 hours.    Invalid input(s): "LABCAST", "WBCUA", "RBCUA",  "CLARITYU", "LEUKOCYTESUR", "BILIRUBINUR", "BLOODU"    Imaging:  No results found.    Assessment:  Principal Problem:    COVID-19      Plan:    COVID Infection.  Tested positive : 11/06/22  Admitted  to COVID isolation unit  Placed on Vitamin C, Vitamin D, & Zinc   Pulmonary  consulted  Hypertensive urgency  Continue  norvasc.  Acute Metabolic Encephalopathy  Agitated, treated with haldol  Dementia  HTN  Continue  norvasc.  Continue metoprolol.  AKI  Metabolic acidosis  Lactate is WNL  Fall at home  Diarrhea  Hypernatremia  Hypomagnesemia.  Magnesium was replaced.  Leucocytosis  Monitor the WBC.  Hypokalemia.  K was replaced..  Metabolic acidosis  Check lactic acid  Advanced age  DVT prophylaxis.  Patient was placed on lovenox subcutaneous bid.  DC Planning.  PT/OT/SS to see for the d/c planning.  To ECF  Code status - DNRCCA/DNI  Precert available tillL 11/17/22    High risk for clinical decline    Electronic Signature:  Hilbert Corrigan, MD 11/13/2022 9:50 AM     Due to the current situation with the COVID - 19 pandemic, to minimize the exposure, the chart in Epic was reviewed. Based on the chart review, discussion with nursing staff & pulmonary service medical decision was made.        Electronically signed by Hilbert Corrigan, MD at 11/13/2022  1:02 PM EST

## 2022-11-13 NOTE — Progress Notes (Signed)
Formatting of this note might be different from the original.                                                Discharge Planning Brief Note    Plan A: [facility] precert approved 12/1-12/5    Plan B:+covid 11/24    Reason for Intervention:   Per IDT huddle, patient discharge canceled this date for further medical workup.   Chasity in admissions notified. Patient can transfer to facility if stable over the weekend.   Contact oncall SW at [phone] for assistance.   Electronically signed by Amy L Simpson, LISW at 11/13/2022 11:32 AM EST

## 2022-11-13 NOTE — Progress Notes (Signed)
Formatting of this note might be different from the original.                                                Discharge Planning Brief Note    Plan A: [facility] precert approved 23/7-62/8    Plan B:+covid 11/24    Reason for Intervention:   Per IDT huddle, patient discharge canceled this date for further medical workup.   Chasity in admissions notified. Patient can transfer to facility if stable over the weekend.   Contact oncall SW at [phone] for assistance.   Electronically signed by Curly Rim, LISW at 11/13/2022 11:32 AM EST

## 2022-11-13 NOTE — Progress Notes (Signed)
Formatting of this note is different from the original.  [facility] COORDINATOR DISCHARGE NOTE    Name: Janice Cunningham  Date: 11/13/2022    MR#: [id number] DOB: 23-Jul-1926   Room #: U3149/F0263-Z Age/Sex: 88 y.o. female   Admit Date: 11/06/2022 Admitting: Helyn Numbers, MD     Services:Discharge to [facility]    Agency:[facility]  [address] [place] [address]  [phone]  [phone] fax    Patient Information    Financial Issues: No financial issues noted  Caregiver Issues: Lives alone, Family/Caregiver unavailable  Environmental Issues: None identified at this time    Transportation  Transported By: Ambulance    Discharge Communication  Discharge Communication To: Patient, Family, ECF Staff, Nurse      Discharge Plan  Discharge Plan  Discharge Destination: Transfer  Plan A: [employer] precert approved 96/2-83/6 (Transport 12/1 @ 1300)  Plan B: +covid 11/24  Anticipated Discharge Disposition/Needs*: ECF/LTAC  Level of Care*: Skilled  Expected Length of Stay <30 days*: Yes  List Provided: Yes  Choices Received: Yes  Referral Sent to Facility: Yes  Facility Acceptance: Yes  Precert Initiated: Yes  Precert Approved: Yes        SW spoke with pt and Daughter,  [place] via TC  , who are in agreement with discharge plan.   Plan for patient to be discharged to [facility].  Pt to be transported by [facility] at 1pm.    Transfer forms completed and sent to ECF.  Bedside RN,  notified of above.  RN to call report.  HENs completed    Electronically signed by Curly Rim, LISW at 11/13/2022 10:08 AM EST

## 2022-11-13 NOTE — Care Plan (Signed)
Formatting of this note might be different from the original.  End of Shift and Plan of Care Summary        No changes overnight    Goals per Patient Condition   Fall Prevention Plan: Patient will remain free from falls. See the Daily cares/safety flowsheet for intervention documentation.  Skin Integrity Plan: Patient skin integrity maintained. See Integumentary flowsheet for intervention documentation.  Isolation Precautions in Use: Isolation precautions in place. See Daily cares/safety flowsheet for intervention documentation.  DVT Prophylaxis in Use: Patient will remain free from DVT. See Daily cares/safety flowsheet for intervention documentation.  Pain Management Plan: Patient is being monitored for pain and response to treatment. See Vital Signs Complex Assessment flowsheet and MAR for intervention documentation.      Goals/Plan for shift  Patient/Family stated goal for shift: maintain comfort  Nursing goal for shift: maintain safety, comfort, promote hydration,remain Afebrile, remain free from falls  Plan: Monitor I&O. ENcourage PO fluids. Monitor temp. administer meds as ordered, fall precautions    Goals/Plan for Hospital Stay  Patient/Family stated goal for hospital stay: maintain comfort  Nursing goal for hospital stay: maintain safety, comfort, promote hydration,remain Afebrile, remain free from falls  Plan Monitor I&O. ENcourage PO fluids. Monitor temp. administer meds as ordered, fall precautions    Electronically signed by Milinda Antis, RN at 11/13/2022  4:47 AM EST

## 2022-11-13 NOTE — Discharge Summary (Signed)
Formatting of this note is different from the original.  Physician Statement  Expected Length of Stay <30 days*: Yes  History & Physical dated:  was reviewed and updated as appropriate by the physician.  Prognosis: Fair  Rehab potential: Fair  To the best of my knowledge the information provided on this document is a timely and accurate reflection of the patient?s condition.  I certify that the patient's level of care is Skilled.  I have completed prescriptions for controlled substances: N/A  Electronically signed by: Hilbert Corrigan, MD 11/13/2022 9:50 AM     Electronically signed by Hilbert Corrigan, MD at 11/13/2022  9:50 AM EST

## 2022-11-13 NOTE — Progress Notes (Signed)
Formatting of this note might be different from the original.  Pt con modified contact/droplet precautions for covid19. Pt started lactated ringers at 50/hr per order. Pt received potassium replacement IV per orders. IV therapy tolerated well. No s/s of adverse reaction noted. Pt con with poor appetite and pocketing food. Speech therapist came to look at patient by request. New order to downgrade to puree diet.   Electronically signed by Westly Walter McCarnan, RN at 11/13/2022  9:12 PM EST

## 2022-11-13 NOTE — Progress Notes (Signed)
Formatting of this note might be different from the original.  Pt con modified contact/droplet precautions for [phone]. Pt started lactated ringers at 50/hr per order. Pt received potassium replacement IV per orders. IV therapy tolerated well. No s/s of adverse reaction noted. Pt con with poor appetite and pocketing food. Speech therapist came to look at patient by request. New order to downgrade to puree diet.   Electronically signed by Bertrum Sol, RN at 11/13/2022  9:12 PM EST

## 2022-11-13 NOTE — Progress Notes (Signed)
Formatting of this note is different from the original.  [facility] SOCIAL WORKER/CARE COORDINATOR DISCHARGE NOTE    Name: Shaine M Cuadros  Date: 11/13/2022    MR#: [id number] DOB: 05/03/1926   Room #: F5540/F5540-A Age/Sex: 88 y.o. female   Admit Date: 11/06/2022 Admitting: Ranjit Katneni, MD     Services:Discharge to SNF    Agency:[facility]  [address] [place] [address]  [phone]  [phone] fax    Patient Information    Financial Issues: No financial issues noted  Caregiver Issues: Lives alone, Family/Caregiver unavailable  Environmental Issues: None identified at this time    Transportation  Transported By: Ambulance    Discharge Communication  Discharge Communication To: Patient, Family, ECF Staff, Nurse      Discharge Plan  Discharge Plan  Discharge Destination: Transfer  Plan A: [facility] precert approved 12/1-12/5 (Transport 12/1 @ 1300)  Plan B: +covid 11/24  Anticipated Discharge Disposition/Needs*: ECF/LTAC  Level of Care*: Skilled  Expected Length of Stay <30 days*: Yes  List Provided: Yes  Choices Received: Yes  Referral Sent to Facility: Yes  Facility Acceptance: Yes  Precert Initiated: Yes  Precert Approved: Yes        SW spoke with pt and Daughter,  [place] via TC  , who are in agreement with discharge plan.   Plan for patient to be discharged to [facility].  Pt to be transported by [facility] at 1pm.    Transfer forms completed and sent to ECF.  Bedside RN,  notified of above.  RN to call report.  HENs completed    Electronically signed by Amy L Simpson, LISW at 11/13/2022 10:08 AM EST

## 2022-11-13 NOTE — Progress Notes (Signed)
Formatting of this note is different from the original.  Palliative Care Progress Note    Name: Janice Cunningham      Room/Bed: E9528/U1324-M  DOB: 02-15-1926         88 y.o.    11/13/2022   HCPOA/Family contact: Janice Cunningham (daughter) [phone]   Code Status: DNR CCA    Impression/Plan     Patient was not physically assessed due to being in isolation for COVID-19.  Writer did collaborate with staff and thoroughly reviewed patient's chart.    Per staff patient remains awake and confused.  Staff at bedside.    Patient is currently on room air with saturation 90%.  Staff reports patient is congested today.  Vitals are stable.  Most recent BP 155/66, receiving an hypertensives. Patient originally presented with weakness.  Patient found to be COVID-positive.  Patient is receiving vitamin D, C and zinc.  WBC count 10.3.  Pulmonology following.  Patient with poor intake.  Patient on regular diet.  Albumin 3.8.  Patient is weak, PT/OT services in place.  Staff reports patient denies pain at this time.    Patient does not have living will or healthcare power of attorney paperwork on file.  Per her daughters patient's daughter Janice Cunningham is her Education officer, community.  Discussion Monday with patient daughter Janice Cunningham related to goals of care and current medical health status, verbalized understanding.  Patient current DNR CCA, no intubation CODE STATUS.  Paperwork on file.    Patient comes from private residence where she lives alone. Recommend skilled services following hospital discharge.  Family voiced that they would like patient to discharge to [facility].  Recommend palliative services following hospital discharge, will follow up.  Patient is transferring to facility today.    1.  Continue DNR CCA, no intubation CODE STATUS  2.  Agree with current plan of care  3.  Continue isolation precautions  4.  Continue PT/OT services  5.  Recommend skilled services following hospital discharge  6.  Recommend palliative  services following hospital discharge, will follow up  7.  Continue pastoral services for supportive care  8.  Writer to continue discussions related to goals of care  9.  Palliative care services will continue to follow    Objective:      Vital Signs:BP: 114/55 (12/01 0339)  Temp: 98.7 F (37.1 C) (12/01 0339)  Pulse: 86 (12/01 0339)  Resp: 16 (12/01 0339)  SpO2: 91 % (12/01 0339)  FiO2 (%): --  O2 Flow Rate (L/min): 0 L/min (12/01 0339)  Cardiac (WDL): --  Cardiac Rhythm: --  Last Filed Weights    11/06/22 2248 11/12/22 0406 11/13/22 0339   Weight: 112 lb 7 oz (51 kg) 108 lb 7.5 oz (49.2 kg) 109 lb 2 oz (49.5 kg)    Body mass index is 19.33 kg/m. Temp (48hrs), Avg:98 F (36.7 C), Min:97.6 F (36.4 C), Max:98.7 F (37.1 C)      Dementia: yes  Delirium: no  Coma: no  Fall Risk: yes      PPS: 40%    Labs:   Recent Labs     11/11/22  0743 11/12/22  0724 11/13/22  0535   WBC 7.8 10.3 15.1*   HEMOGLOBIN 13.4 13.3 13.7   HCT 39.7 39.6 40.7   PLT 237 327 382   NA 140 144 146*   K 3.6 3.5 3.2*   CL 108* 110* 114*   CO2 23 22 20*   BUN 34*  43* 49*   CREATININE 1.00 1.04 1.12   GLU 106 118* 130*   MG 2.0 2.1 2.2     MEDS:   Current Facility-Administered Medications:     acetaminophen (TYLENOL) tablet 500-1,000 mg, 500-1,000 mg, Oral, EVERY 8 HOURS PRN, 500 mg at 11/12/22 1958 **OR** acetaminophen (TYLENOL) tablet 650 mg, 650 mg, Oral, EVERY 6 HOURS PRN, Keri Nicole Hollencamp, APRN-CNP, 650 mg at 11/10/22 2018    amLODIPine (NORVASC) tablet 10 mg, 10 mg, Oral, DAILY, Sivani Siva Pathmarajah, MD, 10 mg at 11/12/22 1019    ascorbic acid (vitamin C) (VITAMIN C) tablet 500 mg, 500 mg, Oral, DAILY, Sivani Siva Pathmarajah, MD, 500 mg at 11/12/22 1019    benzonatate (TESSALON) capsule 200 mg, 200 mg, Oral, EVERY 8 HOURS PRN, Keri Nicole Hollencamp, APRN-CNP, 200 mg at 11/12/22 1959    chlorhexidine gluconate 2 % cloth, , Topical, DAILY AT 12 NOON, Keri Nicole Hollencamp, APRN-CNP, 1 each at 11/13/22 0341    cholecalciferol  (VITAMIN D3) capsule 5,000 Units, 5,000 Units, Oral, DAILY, Sivani Kristeen Miss, MD, 5,000 Units at 11/11/22 1000    docusate (COLACE) capsule 100 mg, 100 mg, Oral, TWO TIMES DAILY PRN, Keri Nicole Hollencamp, APRN-CNP    enoxaparin (LOVENOX) injection 30 mg, 30 mg, Subcutaneous, TWO TIMES DAILY, Keri Nicole Hollencamp, APRN-CNP, 30 mg at 11/12/22 1958    guaiFENesin (MUCINEX) extended-release tablet 600 mg, 600 mg, Oral, 2 times per day, Keri Nicole Hollencamp, APRN-CNP, 600 mg at 11/12/22 1958    metoprolol tartrate (LOPRESSOR) tablet 25 mg, 25 mg, Oral, TWO TIMES DAILY, Sivani Kristeen Miss, MD, 25 mg at 11/12/22 1958    ondansetron (ZOFRAN) injection 4 mg, 4 mg, Intravenous, EVERY 4 HOURS PRN **OR** ondansetron (ZOFRAN-ODT) disintegrating tablet 4 mg, 4 mg, Oral, EVERY 4 HOURS PRN, Keri Nicole Hollencamp, APRN-CNP    oral nutritional supplement 1 packet, 1 packet, Oral, THREE TIMES DAILY WITH MEALS, Sivani Kristeen Miss, MD, 1 packet at 11/12/22 1839    potassium chloride (KLOR-CON M) ER tablet 40 mEq, 40 mEq, Oral, EVERY 6 HOURS, Sivani Siva Pathmarajah, MD    zinc sulfate (ZINCATE) capsule 220 mg, 220 mg, Oral, DAILY, Sivani Kristeen Miss, MD, 220 mg at 11/12/22 1019     MEDSPRN: acetaminophen **OR** acetaminophen, benzonatate, docusate, ondansetron **OR** ondansetron    MEDSINFUSIONS:         Imaging Studies: Reviewed in chart    Spiritual assessment     Seen per chaplain yes  Spiritual needs yes    Greater than 50% of time during encounter was spent counseling and/or coordination of care as documented above.    Electronically Signed:  Lisabeth Register, APRN-CNP 11/13/2022      Electronically signed by Gerrit Halls, DO at 2022/11/18  6:03 PM EST

## 2022-11-13 NOTE — Care Plan (Signed)
Formatting of this note might be different from the original.  End of Shift and Plan of Care Summary        No changes overnight    Goals per Patient Condition   Fall Prevention Plan: Patient will remain free from falls. See the Daily cares/safety flowsheet for intervention documentation.  Skin Integrity Plan: Patient skin integrity maintained. See Integumentary flowsheet for intervention documentation.  Isolation Precautions in Use: Isolation precautions in place. See Daily cares/safety flowsheet for intervention documentation.  DVT Prophylaxis in Use: Patient will remain free from DVT. See Daily cares/safety flowsheet for intervention documentation.  Pain Management Plan: Patient is being monitored for pain and response to treatment. See Vital Signs Complex Assessment flowsheet and MAR for intervention documentation.      Goals/Plan for shift  Patient/Family stated goal for shift: maintain comfort  Nursing goal for shift: maintain safety, comfort, promote hydration,remain Afebrile, remain free from falls  Plan: Monitor I&O. ENcourage PO fluids. Monitor temp. administer meds as ordered, fall precautions    Goals/Plan for Hospital Stay  Patient/Family stated goal for hospital stay: maintain comfort  Nursing goal for hospital stay: maintain safety, comfort, promote hydration,remain Afebrile, remain free from falls  Plan Monitor I&O. ENcourage PO fluids. Monitor temp. administer meds as ordered, fall precautions    Electronically signed by Rose Ishioma Alao, RN at 11/13/2022  4:47 AM EST

## 2022-11-13 NOTE — Progress Notes (Signed)
Formatting of this note is different from the original.  Images from the original note were not included.       Dysphagia Treatment Note    Recommendations: Recommend downgrade to Level I/puree diet consistency, continue Thin liquids - straws ok with use of straw pinch to facilitate single sips. Medication crushed with puree and 1:1 assist to feed. Frequently check oral cavity to ensure no pocketing of items. ST will continue to follow.     Admit date:  11/06/2022          Today's Date: 11/13/2022      Patient Name/MR#:  Janice Cunningham     [id number]   Current Room:  F5540/F5540-A   Admitting Diagnosis: Weakness generalized [R53.1]  COVID-19 [U07.1]    Admitting Provider: Ranjit Katneni, MD   Past Medical/Surgical History:   has a past medical history of COVID-19 virus infection (11/06/2022), Dementia (HCC), and ESBL (extended spectrum beta-lactamase) producing bacteria infection (02/26/2021).  has a past surgical history that includes Hysterectomy.      Recommend continued dysphagia therapy upon discharge: Yes    General  - Subjective: N/A    - Current Medical Status: Janice Cunningham is a 88 y.o. female who presents to the Emergency Department for evaluation of lethargy and increased confusion.  Family reports that the patient ate very little yesterday and was not able to get up and move around and slept most of the day.  Pt positive for COVID. Pt was DC'd home and experienced fall. Returns to ED and admitted.     - Testing/Imaging:     CXR 11/13/22:   Read pending     - Past therapy for current diagnosis, illness or injury?: Unknown (Patient seen this date for swallow evaluation. Unsure of prior difficulty swallowing - Reg diet/Thin liquids during this admission. No prior ST in Epic review.)      - Prior Instrumental Testing: No      - Premorbid Communication: Unable to determine (Pt extremely HOH)    - Adaptive Devices Used      Hearing Aids: Left in patient's  ear(s)      - Patient Safety/Staff Communication     Upon  Arrival Safety: Staff present, Bed alarm on     End of Session Patient Safety: Bed in lowest position, Bed alarm on     Interdisciplinary Communication: RN updated, White board updated    - Pertinent Information Since Last Session     Pertinent Information Since Last Session: Call recieved from RN - pt with limited acceptance of PO - noted to be pocketing/holding food and pills.    Pain Assessment  Pain Score: Patient unable to rate but no noted signs of pain        Cognition  Mental Status: Alert, Confused  Follows Commands: 1 step, Inconsistently  Orientation: Person  Barriers to Learning: Hearing, Cognition, Judgement/insight    Diet Information  Diet Prior to Admission: Unknown  Current Diet this Admission: Thin liquids, No texture restrictions/regular    Respiratory Status    Oxygen    Oxygen Delivery Device: Nasal cannula    O2 Flow Rate (L/min): 2 L/min    Secretion Management: Xersotomia    Secretion Management  Secretion Management: Other (comment) (Decreased - pt noted with leftover breakfast items in mouth. Oral swabbed/cleared by SLP)    Breath Sounds (documented per chart review)   Standard Breath Sounds  Respiratory Assessment (WDL): Exception to WDL  All Chest Area Breath Sounds:   Anterior Chest  Left Upper Chest Breath Sounds: Clear  Left Lower Chest Breath Sounds: Diminished  Right Upper Chest Breath Sounds: Exp Wheezes, Diminished  Right Middle Chest Breath Sounds: Diminished  Right Lower Chest Breath Sounds: Diminished  Respiratory Pattern: Eupneic  Chest Assessment: Chest Expansion Symmetrical    Today's Treatment Session/Response to Treatment    Trial Presentation Vol Presented Oral Pharyngeal Comments   1 Ice   Expectorated      2 Thin, Straw 3oz  (Decreased control, mild discoordinated a-p transit) Suspected delayed swallow, Coughing-inconsistent  Delayed cough with larger drinks; no overt s/s of aspiration with use of straw pinch by SLP   3 Puree    (Mlid decreased formation - verbal cues  intermittent required to clear) Suspected delayed swallow, Decreased laryngeal elevation palpation (No overt s/s of aspiration)      Swallow Strategies Trialed  Liquid rinse, Verbal cues    Other Therapeutic Interventions Provided   Patient seen this date for swallow treatment. Call received from RN - pt with limited acceptance of PO/medication - RN concerned that he was unable to give patient's medication. Pt was also noted to be pocketing/holding breakfast items. SLP completed oral care - noted leftover items from breakfast remained in mouth. Cleared with swabbing. See above for PO trials given this date.     Patient/Caregiver Education  Education Provided: Role of SLP, Plan of Care  Patient Education Completed: Requires continued education  Family/Caregiver Education Completed: No family/caregiver present    Assessment Summary/Severity  - Diagnosis, Potential, Aspiration Risk    Swallow- Rehab Diagnosis: Mild oral dysphagia, Moderate oral dysphagia, Mild pharyngeal dysphagia    Swallow- Rehab Potential: Fair    Swallow - Risk of Aspiration/Complications: Low    - Recommendations    Liquid Recommendations: Thin    Texture Recommendations: Puree    Medication Administration Recommendations: In Puree Crushed as Able      Recommended Strategies: Small bites/sips, Alternate bites/sips, Upright positioning, Upright 30 minutes after meal (Check oral cavity for pocketing)    - Supervision/Additional Comments    Supervision/Assist with Oral Intake: Strict 1:1 supervision to enforce strategies    Assessment Summary Comments: Mild-moderate oral dysphagia, mild pharyngeal dysphagia. Immediate coughing with large drinks of Thin liquids via straw. No overt s/s of aspiration when SLP used straw pinch to facilitate single sips. Recommend downgrade to Level I/puree diet consistency, continue Thin liquids - straws ok with use of straw pinch to facilitate single sips. Medication crushed with puree and 1:1 assist to feed. Frequently  check oral cavity to ensure no pocketing of items. ST will continue to follow.     Plan  Patient/Family Goal: None stated  Swallow- Treatment Procedures/Intervention to Address Current Goals: Bedside Treatment  Swallow Frequency: 4  Swallow- Duration: 2 weeks  Treat Week Start Dt (Update Weekly or w/ Frequency Change): 11/12/22  Expected Visits This Week (Calculated): 4  Recommend Continued Dysphagia Therapy on Discharge: Yes    Goals  LTG:  Pt will demonstrate tolerance of least restrictive diet level for duration of hospitalization.    STG:  Pt will safely tolerate Regular diet without complications of dysphagia with >90% of PO intake. - Deferred 11/13/22  Pt will safely tolerate Thin liquids without s/s of aspiration risk with >90% of PO intake.  Pt will demonstrate ability to employ safe swallow strategies with min-mod assist.   Pt will participate in VFSS as appropriate    New STGs 11/13/22:   Pt will safely tolerate   Level I/puree diet without complications of dysphagia with >90% of PO intake.  Pt will safely tolerate trials of Level II/mech soft diet consistency without complications of dysphagia in >90% of PO intake.    POC DUE  Dysphagia POC Due: 11/26/22    Treatment/Minutes  Swallow -Time Treatment Initiated: 5188  Swallow -Treatment Time Out: 4166  [id number] Total Treatment Time: 15      Electronically signed by Charm Barges, SLP at 11/13/2022 12:30 PM EST

## 2022-11-13 NOTE — Discharge Summary (Signed)
Formatting of this note is different from the original.  Physician Statement  Expected Length of Stay <30 days*: Yes  History & Physical dated:  was reviewed and updated as appropriate by the physician.  Prognosis: Fair  Rehab potential: Fair  To the best of my knowledge the information provided on this document is a timely and accurate reflection of the patient?s condition.  I certify that the patient's level of care is Skilled.  I have completed prescriptions for controlled substances: N/A  Electronically signed by: Sivani Siva Pathmarajah, MD 11/13/2022 9:50 AM     Electronically signed by Sivani Siva Pathmarajah, MD at 11/13/2022  9:50 AM EST

## 2022-11-14 NOTE — Progress Notes (Signed)
Formatting of this note is different from the original.  Hospitalist Progress Note    Patient Name:  Janice Cunningham     Date:  11/14/2022    Admitted complain:  SOB    Active Problem/s:  COVID infection    Subjective:  Pt was seen in the room  She is confused.  The WBC is still high.  Na is 151  Awaiting to be d/cd to ECF.  Pt is in the COVID isolation unit. To minimize the exposure,pt was examined with some limitations.  Objective:  Physical Exam:    BP (!) 144/68   Pulse 100   Temp 98.8 F (37.1 C)   Resp 16   Ht 5\' 3"  (1.6 m)   Wt 125 lb 10.6 oz (57 kg)   SpO2 93%   BMI 22.26 kg/m   BP (!) 144/68   Pulse 100   Temp 98.8 F (37.1 C)   Resp 16   Ht 5\' 3"  (1.6 m)   Wt 125 lb 10.6 oz (57 kg)   SpO2 93%   BMI 22.26 kg/m   General appearance: alert.  Lungs: clear to auscultation bilaterally  Heart: regular rate and rhythm, S1, S2 normal, no murmur, click, rub or gallop  Abdomen: soft, non-tender; bowel sounds normal; no masses,  no organomegaly  Extremities: extremities normal, atraumatic, no cyanosis or edema  Neurologic: Grossly normal    Labs:   CBC:   Recent Labs     11/12/22  0724 11/13/22  0535 11/14/22  0542   WBC 10.3 15.1* 16.7*   HEMOGLOBIN 13.3 13.7 13.4   HCT 39.6 40.7 39.9   MCV 92.8 92.3 92.7   PLT 327 382 418*     BMP:   Recent Labs     11/12/22  0724 11/13/22  0535 11/14/22  0542   NA 144 146* 151*   K 3.5 3.2* 3.8   CL 110* 114* 120*   CO2 22 20* 23   BUN 43* 49* 44*   CREATININE 1.04 1.12 1.05     LIVER PROFILE:   No results for input(s): "AST", "ALT", "LIPASE", "AMYLASE", "BILIDIR", "BILITOT", "ALKPHOS" in the last 72 hours.    PT/INR:  No results found for: "PROTIME", "INR"  PTT:  No results found for: "APTT"  UA: No results for input(s): "NITRITE", "COLORU", "PHUR", "MUCUS", "TRICHOMONAS", "YEAST", "BACTERIA", "SPECGRAV", "UROBILINOGEN", "GLUCOSEU", "KETONESU", "AMORPHOUS" in the last 72 hours.    Invalid input(s): "LABCAST", "WBCUA", "RBCUA", "CLARITYU", "LEUKOCYTESUR",  "BILIRUBINUR", "BLOODU"    Imaging:  XR-CHEST PORTABLE    Result Date: 11/13/2022  IMPRESSION: Mild blunting of the lower right heart border in the medial right cardiophrenic angle. Nonspecific and may be from positioning/artifact. However, other possibilities exist (such as an underlying mass lesion or focal consolidation). When patient is able, would recommend PA and lateral view of the chest. Otherwise hyperinflation and emphysematous change not significantly different from 11/06/2022 Electronically Signed by: Monte Fantasia, MD, 11/13/2022 12:34 PM      Assessment:  Principal Problem:    COVID-19      Plan:    COVID Infection.  Tested positive : 11/06/22  Admitted  to [facility] isolation unit  Placed on Vitamin C, Vitamin D, & Zinc   Pulmonary  consulted  Hypertensive urgency  Continue  norvasc.  Acute Metabolic Encephalopathy  Agitated, treated with haldol  Dementia  HTN  Continue  norvasc.  Continue metoprolol.  AKI  Metabolic acidosis  Lactate is WNL  Fall at  home  Diarrhea  Hypernatremia  Hypomagnesemia.  Magnesium was replaced.  Leucocytosis  Monitor the WBC.  Hypokalemia.  K was replaced..  Metabolic acidosis  Check lactic acid  Advanced age  DVT prophylaxis.  Patient was placed on lovenox subcutaneous bid.  DC Planning.  PT/OT/SS to see for the d/c planning.  To ECF  Code status - DNRCCA/DNI  Precert available tillL 11/17/22    High risk for clinical decline    Electronic Signature:  Wynona Luna, MD 11/14/2022 11:01 AM     Due to the current situation with the COVID - 19 pandemic, to minimize the exposure, the chart in Epic was reviewed. Based on the chart review, discussion with nursing staff & pulmonary service medical decision was made.        Electronically signed by Wynona Luna, MD at 11/14/2022  2:23 PM EST

## 2022-11-14 NOTE — Progress Notes (Signed)
Formatting of this note is different from the original.   Occupational Therapy Treatment    Admit date:  11/06/2022          Today's Date: 11/14/2022      Patient Name/MR#:  Janice Cunningham     [id number]   Current Room:  I4332/R5188-C   Admitting Diagnosis: Weakness generalized [R53.1]  COVID-19 [U07.1]    Admitting Provider: Helyn Numbers, MD    Past Medical/Surgical History:   has a past medical history of COVID-19 virus infection (11/06/2022), Dementia ([place]), and ESBL (extended spectrum beta-lactamase) producing bacteria infection (02/26/2021).  has a past surgical history that includes Hysterectomy.      OT Discharge Recommendations  1. Occupational Therapy services recommended to continue after discharge: Yes  2. Supervision for safety required: Full time supervision required: Unobserved for brief periods of time only but not left alone  3. Physical assistance required for daily care: Total Physical Assistance required: Caregiver supplies 76-100% of physical assistance  4. OT Discharge Equipment Needs If Returning Home: Other (Comment) (defer to next level of care)      Functional Outcomes/Testing and Reporting   Daily Activity AM-PAC 6 Click:  7    General  - Subjective: Pt agreed to getting out of bed but pain in her RUE prevented safe movement toward sitting EOB.    - Current Medical Status: Janice Cunningham is a 88 y.o. female who presents to the Emergency Department for evaluation of lethargy and increased confusion.  Family reports that the patient ate very little yesterday and was not able to get up and move around and slept most of the day.  Pt positive for COVID. Pt was Twining home and experienced fall. Returns to ED and admitted.    - Testing/Imaging: CT-ANGIO HEAD&NECK   IMPRESSION: 1.  No aneurysms, vascular occlusions, or intracranial stenoses identified. 2.  No significant stenosis in the extracranial vertebral or internal carotid arteries. 50% stenosis of left external carotid artery origin. XR-WRIST  LEFT 2 VIEWS   IMPRESSION: No acute osseous abnormality. Mild to moderate degenerative joint disease. XR-FOREARM LEFT 2 VIEWS IMPRESSION: No acute osseous abnormality or significant degenerative joint disease.  XR-WRIST RIGHT 2 VIEWS  IMPRESSION: No acute osseous abnormality. Mild degenerative joint disease. XR-FOREARM RIGHT 2 VIEWS IMPRESSION: No acute osseous abnormality or significant degenerative joint disease.     XR-HIP BIL 3-4 VIEWS W/ PELVIS   IMPRESSION: No acute osseous abnormality. Mild hip joint space narrowing most consistent with degenerative osteoarthritis.    Home Environment  Source: Patient is unreliable historian (Pt with dementia at baseline and HOH, unable to get PLOF)          Premorbid Communication: Wears hearing aids, Hearing impaired, Wears glasses/contacts  Premorbid Cognition: Impaired    Weightbearing Status  Acute Weight Bearing Status: Full Weight Bearing (No restrictions ordered)    Therapy Precautions   (RUE with soft splint, high pain onset with movement, no precautions noted in chart)  Additional Comments:     Orthotics/Braces   (soft splint w ace bandage, immobilizing L elbow in full extention)    Session Beginning Location, Safety Info  Upon Arrival Patient Location: In bed   Upon Arrival Safety: Bed alarm on      Pain Assessment  Pain Score: Patient unable to rate but signs of pain noted  Pain Location: RUE  Pain Intervention: Repositioned, Activity modified, Rest breaks    Oxygen Administration Details  Oxygen (L/m): 2  Oxygen Delivery Device: NC  Pertinent Medication/Information  Therapist attempted to transfer patient supine to sitting EOB, with onset of pain in RUE with any touching or moving of  the RUE. OT engaged in BUE AAROM as permitted by the patient, with Max VC for encouragement and participation. Pt was repositioned in bed for optimal breathing and posture.     Activity Tolerance  Functional Activity Tolerance: Tolerate less than 10 min activity with significant  change in vital signs  Modified Borg Scale (RPE) at Rest: No breathlessness at all/at rest  Modified Borg Scale (RPE) with Activity: Slight breathlessness/easy activity    Task Current Assist Level Devices, Other Details   Grooming    Total assist    UB Dressing        Bed Mobility    Total assist    Sit to/from Stand        Toilet Transfer              Goals  OT POC Due: 11/22/22  Feeding  GOAL: Will complete feeding with: Independence    Treatment This Date:    Feeding Status: Not Addressed During Today?s Session          Grooming  GOAL: Will complete grooming/simple hygiene with: Distant supervision (Seated)    Treatment This Date:    Grooming Status: Addressed During Today?s Session    Grooming Assistance Level: Total assist    Assistance Required for: Verbal cueing, Increased time to complete, Bringing hand to head, Assist for reach, Insight, Motor planning, Task completion    Task(s) Completed: Engaged patient in hair brushing. Pt able to grasp handle when brush is placed in her L hand. Pt appears unaware of what it is or how to use it, even with hand over hand movement in PROM    Patient Position: Sitting in bed    UB Dressing  GOAL: Will complete upper body (UB) dressing with: Minimal assist    Treatment This Date:     UB Dressing Status: Not Addressed During Today?s Session            LB Dressing  GOAL: Will complete lower body (LB) dressing with: Maximal assist    Treatment This Date:    LB Dressing Status: Not Addressed During Today?s Session            Bathing  GOAL: Will complete bathing with: Maximal assist    Treatment This Date:     Bathing Status: Not Addressed During Today?s Session            Toileting  GOAL: Will complete toileting with: Maximal assist    Treatment This Date:     Toileting Status: Not Addressed During Today?s Session          Bed Mobility  GOAL: Will complete bed mobility with: Moderate assist    Treatment This Date:    Bed Mobility Status: Addressed During Today?s Session    Bed  Mobility Assist Level: Total assist    Assistance Required for: HOB elevated, Increased time to complete, Verbal cues for technique, Rolling, Motor planning      Sit to/from Stand  GOAL: Will perform sit to/from stand with: Moderate assist, Least restrictive assistive device    Treatment This Date:    Sit to from Stand Status: Not Addressed During Today?s Session          Functional Mobility  GOAL: Patient will perform functional mobility task with: Moderate assist, Least restrictive assistive device    Treatment This Date:      Functional Mobility Status: Not Addressed During Today?s Session            Toilet Transfer  GOAL: Patient will perform toilet transfer: Moderate assist, Least restrictive assistive device    Treatment This Date:    Toilet Transfer Status: Not Addressed During Today?s Session            Bed to/from Chair  GOAL: Will perform bed to/from chair with: Moderate assist, Least restrictive assistive device    Treatment This Date:    Bed to/from Chair Status: Not Addressed During Today?s Session          Home Exercise Goal Status  GOAL: To complete HEP : With minimal verbal cues    Treatment This Date:    HEP Status: Addressed During Today?s Session    Assistance Required for: Verbal cues for technique, Verbal cues for completing through full ROM, Physical assist for completion, Physical assist for technique    Patient Position: Sitting in bed    Exercises Performed: AAROM in BUE, 1 set of 5, pt not tolerating further motion, especially in RUE    Type of Exercise/Resistance Used: AAROM, Against gravity    Sets: 1    Reps: 5    Patient/Caregiver Education  Education Provided: Use of call light for assistance, Body mechanics, Importance of mobility  Patient Education Completed: Requires continued education  Family/Caregiver Education Completed: No family/caregiver present    Session Ending Location, Safety Info  End of Session Patient Location: Returned to bed  End of Session Patient Safety: Bed alarm  on, Call light within reach, Lines and drains intact   Interdisciplinary Communication: Spoke with RN prior to session, RN updated on patient performance, Spoke with PT    Plan  OT Patient/Family Goal: To go back home  Treatment Interventions to Address Currrent Goals: Self care/Home Management, Therapeutic activities, Therapeutic exercise   OT Frequency: 5x/wk  OT Duration of Treatment: 2 weeks  OT Co-Treat?: Yes  Reason for Cotreatment: Poor activity tolerance for participation in multiple treatment sessions, Requires > mod assist for progressive mobility out of bed activities    Treatment Time/Minutes  Treatment- Time Initiated: 1410  Time Treatment Completed: 1436  Total Treatment Time: 26    Reason for Cotreatment: Poor activity tolerance for participation in multiple treatment sessions, Requires > mod assist for progressive mobility out of bed activities  Electronically signed by Mark Chapin, MOT, OTR/L at 11/14/2022  4:28 PM EST

## 2022-11-14 NOTE — Progress Notes (Signed)
Formatting of this note is different from the original.   Occupational Therapy Treatment    Admit date:  11/06/2022          Today's Date: 11/14/2022      Patient Name/MR#:  Janice Cunningham     [id number]   Current Room:  I4332/R5188-C   Admitting Diagnosis: Weakness generalized [R53.1]  COVID-19 [U07.1]    Admitting Provider: Helyn Numbers, MD    Past Medical/Surgical History:   has a past medical history of COVID-19 virus infection (11/06/2022), Dementia ([place]), and ESBL (extended spectrum beta-lactamase) producing bacteria infection (02/26/2021).  has a past surgical history that includes Hysterectomy.      OT Discharge Recommendations  1. Occupational Therapy services recommended to continue after discharge: Yes  2. Supervision for safety required: Full time supervision required: Unobserved for brief periods of time only but not left alone  3. Physical assistance required for daily care: Total Physical Assistance required: Caregiver supplies 76-100% of physical assistance  4. OT Discharge Equipment Needs If Returning Home: Other (Comment) (defer to next level of care)      Functional Outcomes/Testing and Reporting   Daily Activity AM-PAC 6 Click:  7    General  - Subjective: Pt agreed to getting out of bed but pain in her RUE prevented safe movement toward sitting EOB.    - Current Medical Status: Janice Cunningham is a 88 y.o. female who presents to the Emergency Department for evaluation of lethargy and increased confusion.  Family reports that the patient ate very little yesterday and was not able to get up and move around and slept most of the day.  Pt positive for COVID. Pt was Twining home and experienced fall. Returns to ED and admitted.    - Testing/Imaging: CT-ANGIO HEAD&NECK   IMPRESSION: 1.  No aneurysms, vascular occlusions, or intracranial stenoses identified. 2.  No significant stenosis in the extracranial vertebral or internal carotid arteries. 50% stenosis of left external carotid artery origin. XR-WRIST  LEFT 2 VIEWS   IMPRESSION: No acute osseous abnormality. Mild to moderate degenerative joint disease. XR-FOREARM LEFT 2 VIEWS IMPRESSION: No acute osseous abnormality or significant degenerative joint disease.  XR-WRIST RIGHT 2 VIEWS  IMPRESSION: No acute osseous abnormality. Mild degenerative joint disease. XR-FOREARM RIGHT 2 VIEWS IMPRESSION: No acute osseous abnormality or significant degenerative joint disease.     XR-HIP BIL 3-4 VIEWS W/ PELVIS   IMPRESSION: No acute osseous abnormality. Mild hip joint space narrowing most consistent with degenerative osteoarthritis.    Home Environment  Source: Patient is unreliable historian (Pt with dementia at baseline and HOH, unable to get PLOF)          Premorbid Communication: Wears hearing aids, Hearing impaired, Wears glasses/contacts  Premorbid Cognition: Impaired    Weightbearing Status  Acute Weight Bearing Status: Full Weight Bearing (No restrictions ordered)    Therapy Precautions   (RUE with soft splint, high pain onset with movement, no precautions noted in chart)  Additional Comments:     Orthotics/Braces   (soft splint w ace bandage, immobilizing L elbow in full extention)    Session Beginning Location, Safety Info  Upon Arrival Patient Location: In bed   Upon Arrival Safety: Bed alarm on      Pain Assessment  Pain Score: Patient unable to rate but signs of pain noted  Pain Location: RUE  Pain Intervention: Repositioned, Activity modified, Rest breaks    Oxygen Administration Details  Oxygen (L/m): 2  Oxygen Delivery Device: NC  Pertinent Medication/Information  Therapist attempted to transfer patient supine to sitting EOB, with onset of pain in RUE with any touching or moving of  the RUE. OT engaged in [facility] as permitted by the patient, with Max VC for encouragement and participation. Pt was repositioned in bed for optimal breathing and posture.     Activity Tolerance  Functional Activity Tolerance: Tolerate less than 10 min activity with significant  change in vital signs  Modified Borg Scale (RPE) at Rest: No breathlessness at all/at rest  Modified Borg Scale (RPE) with Activity: Slight breathlessness/easy activity    Task Current Assist Level Devices, Other Details   Grooming    Total assist    UB Dressing        Bed Mobility    Total assist    Sit to/from Stand        Toilet Transfer              Goals  OT POC Due: 11/22/22  Feeding  GOAL: Will complete feeding with: Independence    Treatment This Date:    Feeding Status: Not Addressed During Today?s Session          Grooming  GOAL: Will complete grooming/simple hygiene with: Distant supervision (Seated)    Treatment This Date:    Grooming Status: Addressed During Today?s Session    Grooming Assistance Level: Total assist    Assistance Required for: Verbal cueing, Increased time to complete, Bringing hand to head, Assist for reach, Insight, Motor planning, Task completion    Task(s) Completed: Engaged patient in hair brushing. Pt able to grasp handle when brush is placed in her L hand. Pt appears unaware of what it is or how to use it, even with hand over hand movement in PROM    Patient Position: Sitting in bed    UB Dressing  GOAL: Will complete upper body (UB) dressing with: Minimal assist    Treatment This Date:     UB Dressing Status: Not Addressed During Today?s Session            LB Dressing  GOAL: Will complete lower body (LB) dressing with: Maximal assist    Treatment This Date:    LB Dressing Status: Not Addressed During Today?s Session            Bathing  GOAL: Will complete bathing with: Maximal assist    Treatment This Date:     Bathing Status: Not Addressed During Today?s Session            Toileting  GOAL: Will complete toileting with: Maximal assist    Treatment This Date:     Toileting Status: Not Addressed During Today?s Session          Bed Mobility  GOAL: Will complete bed mobility with: Moderate assist    Treatment This Date:    Bed Mobility Status: Addressed During Today?s Session    Bed  Mobility Assist Level: Total assist    Assistance Required for: HOB elevated, Increased time to complete, Verbal cues for technique, Rolling, Motor planning      Sit to/from Stand  GOAL: Will perform sit to/from stand with: Moderate assist, Least restrictive assistive device    Treatment This Date:    Sit to from Stand Status: Not Addressed During Today?s Session          Functional Mobility  GOAL: Patient will perform functional mobility task with: Moderate assist, Least restrictive assistive device    Treatment This Date:  Functional Mobility Status: Not Addressed During Today?s Session            Toilet Transfer  GOAL: Patient will perform toilet transfer: Moderate assist, Least restrictive assistive device    Treatment This Date:    Toilet Transfer Status: Not Addressed During Today?s Session            Bed to/from Chair  GOAL: Will perform bed to/from chair with: Moderate assist, Least restrictive assistive device    Treatment This Date:    Bed to/from Chair Status: Not Addressed During Today?s Session          Home Exercise Goal Status  GOAL: To complete HEP : With minimal verbal cues    Treatment This Date:    HEP Status: Addressed During Today?s Session    Assistance Required for: Verbal cues for technique, Verbal cues for completing through full ROM, Physical assist for completion, Physical assist for technique    Patient Position: Sitting in bed    Exercises Performed: AAROM in BUE, 1 set of 5, pt not tolerating further motion, especially in RUE    Type of Exercise/Resistance Used: AAROM, Against gravity    Sets: 1    Reps: 5    Patient/Caregiver Education  Education Provided: Use of call light for assistance, Body mechanics, Importance of mobility  Patient Education Completed: Requires continued education  Family/Caregiver Education Completed: No family/caregiver present    Session Ending Location, Safety Info  End of Session Patient Location: Returned to bed  End of Session Patient Safety: Bed alarm  on, Call light within reach, Lines and drains intact   Interdisciplinary Communication: Spoke with RN prior to session, RN updated on patient performance, Spoke with PT    Plan  OT Patient/Family Goal: To go back home  Treatment Interventions to Address Currrent Goals: Self care/Home Management, Therapeutic activities, Therapeutic exercise   OT Frequency: 5x/wk  OT Duration of Treatment: 2 weeks  OT Co-Treat?: Yes  Reason for Cotreatment: Poor activity tolerance for participation in multiple treatment sessions, Requires > mod assist for progressive mobility out of bed activities    Treatment Time/Minutes  Treatment- Time Initiated: 1410  Time Treatment Completed: 1436  Total Treatment Time: 26    Reason for Cotreatment: Poor activity tolerance for participation in multiple treatment sessions, Requires > mod assist for progressive mobility out of bed activities  Electronically signed by Jearld Lesch, MOT, OTR/L at 11/14/2022  4:28 PM EST

## 2022-11-14 NOTE — Progress Notes (Signed)
Formatting of this note might be different from the original.  Dextrose 5% ordered but unavailable. Central supply contacted, waiting to be delivered.   Electronically signed by Paige Kathleen Bowden, LPN at 11/14/2022 12:44 PM EST

## 2022-11-14 NOTE — Care Plan (Signed)
Formatting of this note might be different from the original.  End of Shift and Plan of Care Summary          Alert and oriented x 1  Patient able to communicate needs, actively participating in care.   VSS, Respirations even and unlabored. Afebrile.   Pt refusing food this shift. Pocketing medication and food, able to follow commands when caregiver is mentioned but still not eating.   No S/S of acute distress noted.   Telemetry not ordered   IV patent,   Patient has had some complaints of pain with PRN medication given as appropriate.  Patient incontinent of bladder and bowel  Visitors: grand daughter at bedside    Fall Prevention: safety maintained, fall risk protocol in place.   Bed in lowest position with wheels locked.   Yellow socks and bed alarm on for safety.  Side rails up x2. Call light and bedside table within reach.   Patient agrees to call for assistance before getting out of bed.     Discharge: Patient from snf  with expectation to [facility] upon discharge. Will continue to monitor.      Goals per Patient Condition   Fall Prevention Plan: Patient will remain free from falls. See the Daily cares/safety flowsheet for intervention documentation.  Skin Integrity Plan: Patient skin integrity maintained. See Integumentary flowsheet for intervention documentation.  Isolation Precautions in Use: Isolation precautions in place. See Daily cares/safety flowsheet for intervention documentation.  DVT Prophylaxis in Use: Patient will remain free from DVT. See Daily cares/safety flowsheet for intervention documentation.  Pain Management Plan: Patient is being monitored for pain and response to treatment. See Vital Signs Complex Assessment flowsheet and MAR for intervention documentation.      Goals/Plan for shift  Patient/Family stated goal for shift: maintain comfort and enc. turning and repositioning  Nursing goal for shift: droplet isolation and monitor COVID symptoms  Plan: Monitor and assess  pt    Goals/Plan for Hospital Stay  Patient/Family stated goal for hospital stay: maintain comfort and turning pt.  Nursing goal for hospital stay: droplet isolation and fall risk, IVF's  Plan monitor pt, and horly rounds    Electronically signed by Reita May, LPN at 21/30/8657  8:46 PM EST

## 2022-11-14 NOTE — Care Plan (Signed)
Formatting of this note might be different from the original.  End of Shift and Plan of Care Summary          Alert and oriented x 1  Patient able to communicate needs, actively participating in care.   VSS, Respirations even and unlabored. Afebrile.   Pt refusing food this shift. Pocketing medication and food, able to follow commands when caregiver is mentioned but still not eating.   No S/S of acute distress noted.   Telemetry not ordered   IV patent,   Patient has had some complaints of pain with PRN medication given as appropriate.  Patient incontinent of bladder and bowel  Visitors: grand daughter at bedside    Fall Prevention: safety maintained, fall risk protocol in place.   Bed in lowest position with wheels locked.   Yellow socks and bed alarm on for safety.  Side rails up x2. Call light and bedside table within reach.   Patient agrees to call for assistance before getting out of bed.     Discharge: Patient from snf  with expectation to [facility] upon discharge. Will continue to monitor.      Goals per Patient Condition   Fall Prevention Plan: Patient will remain free from falls. See the Daily cares/safety flowsheet for intervention documentation.  Skin Integrity Plan: Patient skin integrity maintained. See Integumentary flowsheet for intervention documentation.  Isolation Precautions in Use: Isolation precautions in place. See Daily cares/safety flowsheet for intervention documentation.  DVT Prophylaxis in Use: Patient will remain free from DVT. See Daily cares/safety flowsheet for intervention documentation.  Pain Management Plan: Patient is being monitored for pain and response to treatment. See Vital Signs Complex Assessment flowsheet and MAR for intervention documentation.      Goals/Plan for shift  Patient/Family stated goal for shift: maintain comfort and enc. turning and repositioning  Nursing goal for shift: droplet isolation and monitor COVID symptoms  Plan: Monitor and assess  pt    Goals/Plan for Hospital Stay  Patient/Family stated goal for hospital stay: maintain comfort and turning pt.  Nursing goal for hospital stay: droplet isolation and fall risk, IVF's  Plan monitor pt, and horly rounds    Electronically signed by Paige Kathleen Bowden, LPN at 11/14/2022  6:33 PM EST

## 2022-11-14 NOTE — Progress Notes (Signed)
Formatting of this note might be different from the original.  Dextrose 5% ordered but unavailable. Central supply contacted, waiting to be delivered.   Electronically signed by Reita May, LPN at 28/31/5176 16:07 PM EST

## 2022-11-15 NOTE — Progress Notes (Signed)
Formatting of this note might be different from the original.  I was called in due to Pt's death.  I met with daughters and granddaughters in room  They shared some stories about Pt  I prayed with family and arranged for RN to call [facility] in [place].    Electronically signed by Dale E French at 11/21/2022  4:50 PM EST

## 2022-11-15 NOTE — Progress Notes (Signed)
Formatting of this note might be different from the original.  Patient audibly wheezing with gurgling sounds upon inhalation and exhale. Patient vitals assessed, oxygen saturation at 86%. Patient placed on 2L via NC with success, oxygen reaching 92%. Respiratory and MD notified. See new orders. Patient family contacted and advised MD that they do not wish for any further treatments and to change code status to DNR-Comfort Care. See orders.   Electronically signed by Paige Kathleen Bowden, LPN at 11/20/2022  3:01 PM EST

## 2022-11-15 NOTE — Progress Notes (Signed)
Formatting of this note might be different from the original.   Swallow Therapy Treatment Attempt    Admit date:  11/06/2022          Today's Date: 11/27/22      Patient Name/MR#:  Janice Cunningham     [id number]   Current Room:  U4403/K7425-Z   Admitting Diagnosis: Weakness generalized [R53.1]  COVID-19 [U07.1]    Admitting Provider: Helyn Numbers, MD       Treatment Attempt  Date/Time: 27-Nov-2022, 1343  Reason: Therapy medically contraindicated at current time (Pt deciding on comfort measures/hospice - will hold further treatment until goals of care established.)  Electronically signed by Jaci Standard, SLP at Nov 27, 2022  1:43 PM EST

## 2022-11-15 NOTE — Progress Notes (Signed)
Formatting of this note is different from the original.  Hospitalist Progress Note    Patient Name:  Janice Cunningham     Date:  November 18, 2022    Admitted complain:  SOB    Active Problem/s:  COVID infection    Subjective:  Pt was seen in the room  She is confused.  She is congested.  The WBC is still high.  Na is 150  Awaiting to be d/cd to ECF.  Pt is in the COVID isolation unit. To minimize the exposure,pt was examined with some limitations.  Objective:  Physical Exam:    BP (!) 164/77   Pulse (!) 119   Temp 98.9 F (37.2 C)   Resp 20   Ht 5\' 3"  (1.6 m)   Wt 124 lb 9 oz (56.5 kg)   SpO2 (!) 88%   BMI 22.06 kg/m   BP (!) 164/77   Pulse (!) 119   Temp 98.9 F (37.2 C)   Resp 20   Ht 5\' 3"  (1.6 m)   Wt 124 lb 9 oz (56.5 kg)   SpO2 (!) 88%   BMI 22.06 kg/m   General appearance: alert.  Lungs: clear to auscultation bilaterally  Heart: regular rate and rhythm, S1, S2 normal, no murmur, click, rub or gallop  Abdomen: soft, non-tender; bowel sounds normal; no masses,  no organomegaly  Extremities: extremities normal, atraumatic, no cyanosis or edema  Neurologic: Grossly normal    Labs:   CBC:   Recent Labs     11/13/22  0535 11/14/22  0542 11/18/22  0631   WBC 15.1* 16.7* 18.8*   HEMOGLOBIN 13.7 13.4 13.3   HCT 40.7 39.9 40.0   MCV 92.3 92.7 92.3   PLT 382 418* 401*     BMP:   Recent Labs     11/13/22  0535 11/14/22  0542 November 18, 2022  0631   NA 146* 151* 150*   K 3.2* 3.8 3.5   CL 114* 120* 119*   CO2 20* 23 23   BUN 49* 44* 37*   CREATININE 1.12 1.05 0.98     LIVER PROFILE:   No results for input(s): "AST", "ALT", "LIPASE", "AMYLASE", "BILIDIR", "BILITOT", "ALKPHOS" in the last 72 hours.    PT/INR:  No results found for: "PROTIME", "INR"  PTT:  No results found for: "APTT"  UA: No results for input(s): "NITRITE", "COLORU", "PHUR", "MUCUS", "TRICHOMONAS", "YEAST", "BACTERIA", "SPECGRAV", "UROBILINOGEN", "GLUCOSEU", "KETONESU", "AMORPHOUS" in the last 72 hours.    Invalid input(s): "LABCAST", "WBCUA", "RBCUA",  "CLARITYU", "LEUKOCYTESUR", "BILIRUBINUR", "BLOODU"    Imaging:  XR-CHEST PORTABLE    Result Date: 11/13/2022  IMPRESSION: Mild blunting of the lower right heart border in the medial right cardiophrenic angle. Nonspecific and may be from positioning/artifact. However, other possibilities exist (such as an underlying mass lesion or focal consolidation). When patient is able, would recommend PA and lateral view of the chest. Otherwise hyperinflation and emphysematous change not significantly different from 11/06/2022 Electronically Signed by: Monte Fantasia, MD, 11/13/2022 12:34 PM      Assessment:  Principal Problem:    COVID-19      Plan:    Chest congestion  CXR  IV lasix 40 mg times one  Pulmonary  reconsulted  COVID Infection.  Tested positive : 11/06/22  Admitted  to [facility] isolation unit  Placed on Vitamin C, Vitamin D, & Zinc   Pulmonary  consulted  Hypertensive urgency  Continue  norvasc.  Acute Metabolic Encephalopathy  Agitated, treated with  haldol  Dementia  HTN  Continue  norvasc.  Continue metoprolol.  AKI  Metabolic acidosis  Lactate is WNL  Fall at home  Diarrhea  Hypernatremia  Stop dextrose gtt  Get CXR  Hypomagnesemia.  Magnesium was replaced.  Leucocytosis  Monitor the WBC.  Hypokalemia.  K was replaced..  Metabolic acidosis  Check lactic acid  Advanced age  DVT prophylaxis.  Patient was placed on lovenox subcutaneous bid.  DC Planning.  PT/OT/SS to see for the d/c planning.  To ECF  Code status - DNRCCA/DNI  Precert available tillL 11/17/22    High risk for clinical decline    Electronic Signature:  Wynona Luna, MD 12/07/22 9:30 AM     Due to the current situation with the COVID - 19 pandemic, to minimize the exposure, the chart in Epic was reviewed. Based on the chart review, discussion with nursing staff & pulmonary service medical decision was made.      Talked to pt's daughter who is the POA. She wants to go with hospice. Comfort meds were ordered.  Time spent more than 50  mins.    Electronically signed by Wynona Luna, MD at 2022/12/07  1:20 PM EST

## 2022-11-15 NOTE — Progress Notes (Signed)
Formatting of this note might be different from the original.  Family notified this nurse of absence of respirations. Patient assessed, no heart tones or respirations noted at 1607  by  Suzan RN and Benay Pillow. Dr. Elenora Fender, MD notified by secured text  at 1611. Time of death was  pronounced by the physician at the above assessment time.   Electronically signed by Reita May, LPN at 50/53/9767  3:41 PM EST

## 2022-11-15 NOTE — Progress Notes (Signed)
Formatting of this note might be different from the original.                                                Discharge Planning Brief Note    Plan A: [facility] precert approved 12/1-12/5    Plan B:+covid 11/24    Reason for Intervention: Received hospice consult on pt. Spoke with pt's daughter Alberta. She was aware of the consult and wanted to pursue hospice services. Provided a verbal list of hospice agencies. Referral made to [facility]. Face sheet faxed. HOC to contact Alberta to set up a time to meet.  Electronically signed by Ilyse E Wells, MSW, LISW-S at 12/03/2022  2:43 PM EST

## 2022-11-15 NOTE — Progress Notes (Signed)
Formatting of this note might be different from the original.  Patient audibly wheezing with gurgling sounds upon inhalation and exhale. Patient vitals assessed, oxygen saturation at 86%. Patient placed on 2L via NC with success, oxygen reaching 92%. Respiratory and MD notified. See new orders. Patient family contacted and advised MD that they do not wish for any further treatments and to change code status to DNR-Comfort Care. See orders.   Electronically signed by Reita May, LPN at 90/24/0973  5:32 PM EST

## 2022-11-15 NOTE — Progress Notes (Signed)
Formatting of this note might be different from the original.  Family notified this nurse of absence of respirations. Patient assessed, no heart tones or respirations noted at 1607  by  Suzan RN and Angela RN. Dr. Pathmarajah, MD notified by secured text  at 1611. Time of death was  pronounced by the physician at the above assessment time.   Electronically signed by Paige Kathleen Bowden, LPN at 11/18/2022  5:05 PM EST

## 2022-11-15 NOTE — Progress Notes (Signed)
Formatting of this note might be different from the original.   Swallow Therapy Treatment Attempt    Admit date:  11/06/2022          Today's Date: 12/02/2022      Patient Name/MR#:  Janice Cunningham     [id number]   Current Room:  F5540/F5540-A   Admitting Diagnosis: Weakness generalized [R53.1]  COVID-19 [U07.1]    Admitting Provider: Ranjit Katneni, MD       Treatment Attempt  Date/Time: 12/02/2022, 1343  Reason: Therapy medically contraindicated at current time (Pt deciding on comfort measures/hospice - will hold further treatment until goals of care established.)  Electronically signed by Kristin Nicole Zachman, SLP at 12/03/2022  1:43 PM EST

## 2022-11-15 NOTE — Progress Notes (Signed)
Formatting of this note is different from the original.  Hospitalist Progress Note    Patient Name:  Janice Cunningham     Date:  November 18, 2022    Admitted complain:  SOB    Active Problem/s:  COVID infection    Subjective:  Pt was seen in the room  She is confused.  She is congested.  The WBC is still high.  Na is 150  Awaiting to be d/cd to ECF.  Pt is in the COVID isolation unit. To minimize the exposure,pt was examined with some limitations.  Objective:  Physical Exam:    BP (!) 164/77   Pulse (!) 119   Temp 98.9 F (37.2 C)   Resp 20   Ht 5\' 3"  (1.6 m)   Wt 124 lb 9 oz (56.5 kg)   SpO2 (!) 88%   BMI 22.06 kg/m   BP (!) 164/77   Pulse (!) 119   Temp 98.9 F (37.2 C)   Resp 20   Ht 5\' 3"  (1.6 m)   Wt 124 lb 9 oz (56.5 kg)   SpO2 (!) 88%   BMI 22.06 kg/m   General appearance: alert.  Lungs: clear to auscultation bilaterally  Heart: regular rate and rhythm, S1, S2 normal, no murmur, click, rub or gallop  Abdomen: soft, non-tender; bowel sounds normal; no masses,  no organomegaly  Extremities: extremities normal, atraumatic, no cyanosis or edema  Neurologic: Grossly normal    Labs:   CBC:   Recent Labs     11/13/22  0535 11/14/22  0542 11/18/22  0631   WBC 15.1* 16.7* 18.8*   HEMOGLOBIN 13.7 13.4 13.3   HCT 40.7 39.9 40.0   MCV 92.3 92.7 92.3   PLT 382 418* 401*     BMP:   Recent Labs     11/13/22  0535 11/14/22  0542 November 18, 2022  0631   NA 146* 151* 150*   K 3.2* 3.8 3.5   CL 114* 120* 119*   CO2 20* 23 23   BUN 49* 44* 37*   CREATININE 1.12 1.05 0.98     LIVER PROFILE:   No results for input(s): "AST", "ALT", "LIPASE", "AMYLASE", "BILIDIR", "BILITOT", "ALKPHOS" in the last 72 hours.    PT/INR:  No results found for: "PROTIME", "INR"  PTT:  No results found for: "APTT"  UA: No results for input(s): "NITRITE", "COLORU", "PHUR", "MUCUS", "TRICHOMONAS", "YEAST", "BACTERIA", "SPECGRAV", "UROBILINOGEN", "GLUCOSEU", "KETONESU", "AMORPHOUS" in the last 72 hours.    Invalid input(s): "LABCAST", "WBCUA", "RBCUA",  "CLARITYU", "LEUKOCYTESUR", "BILIRUBINUR", "BLOODU"    Imaging:  XR-CHEST PORTABLE    Result Date: 11/13/2022  IMPRESSION: Mild blunting of the lower right heart border in the medial right cardiophrenic angle. Nonspecific and may be from positioning/artifact. However, other possibilities exist (such as an underlying mass lesion or focal consolidation). When patient is able, would recommend PA and lateral view of the chest. Otherwise hyperinflation and emphysematous change not significantly different from 11/06/2022 Electronically Signed by: Monte Fantasia, MD, 11/13/2022 12:34 PM      Assessment:  Principal Problem:    COVID-19      Plan:    Chest congestion  CXR  IV lasix 40 mg times one  Pulmonary  reconsulted  COVID Infection.  Tested positive : 11/06/22  Admitted  to [facility] isolation unit  Placed on Vitamin C, Vitamin D, & Zinc   Pulmonary  consulted  Hypertensive urgency  Continue  norvasc.  Acute Metabolic Encephalopathy  Agitated, treated with  haldol  Dementia  HTN  Continue  norvasc.  Continue metoprolol.  AKI  Metabolic acidosis  Lactate is WNL  Fall at home  Diarrhea  Hypernatremia  Stop dextrose gtt  Get CXR  Hypomagnesemia.  Magnesium was replaced.  Leucocytosis  Monitor the WBC.  Hypokalemia.  K was replaced..  Metabolic acidosis  Check lactic acid  Advanced age  DVT prophylaxis.  Patient was placed on lovenox subcutaneous bid.  DC Planning.  PT/OT/SS to see for the d/c planning.  To ECF  Code status - DNRCCA/DNI  Precert available tillL 11/17/22    High risk for clinical decline    Electronic Signature:  Wynona Luna, MD Nov 29, 2022 9:30 AM     Due to the current situation with the COVID - 19 pandemic, to minimize the exposure, the chart in Epic was reviewed. Based on the chart review, discussion with nursing staff & pulmonary service medical decision was made.      Talked to pt's daughter who is the POA. She wants to go with hospice. Comfort meds were ordered.  Time spent more than 50  mins.    Electronically signed by Wynona Luna, MD at 11-29-2022  1:20 PM EST

## 2022-11-15 NOTE — Progress Notes (Signed)
Formatting of this note might be different from the original.                                                Discharge Planning Brief Note    Plan A: [facility] precert approved 16/1-09/6    Plan B:+covid 11/24    Reason for Intervention: Received hospice consult on pt. Spoke with pt's daughter [place]. She was aware of the consult and wanted to pursue hospice services. Provided a verbal list of hospice agencies. Referral made to [facility]. Face sheet faxed. HOC to contact [place] to set up a time to meet.  Electronically signed by Barnetta Chapel, MSW, LISW-S at 2022/11/23  2:43 PM EST

## 2022-11-15 NOTE — Progress Notes (Signed)
Formatting of this note might be different from the original.  I was called in due to Pt's death.  I met with daughters and granddaughters in room  They shared some stories about Pt  I prayed with family and arranged for RN to call [facility] in [place].    Electronically signed by Leane Call at 11/20/2022  4:50 PM EST

## 2022-11-25 ENCOUNTER — Encounter: Payer: MEDICARE | Primary: Family Medicine

## 2022-12-01 ENCOUNTER — Encounter: Payer: MEDICARE | Primary: Family Medicine

## 2022-12-14 DEATH — deceased

## 2023-04-28 IMAGING — MR MRI LUMBAR WITHOUT CONTRAST
2 of 8 series · 4 of 48 positions shown · non-contrast
Comparison: Lumbar spine x-ray March 02, 2023.

MRI LUMBAR WITHOUT CONTRAST , 04/28/2023 [DATE]: 
CLINICAL INDICATION: Lower back pain. Lumbar radiculopathy. Right sided
TECHNIQUE: Multiplanar, multiecho position MR images of the lumbar spine were 
performed without contrast.

[Series 2: T2 · sagittal · 4.0mm · 0.26mm/px · 3 of 18 slices shown (1 of 2)]
[im 1/18]
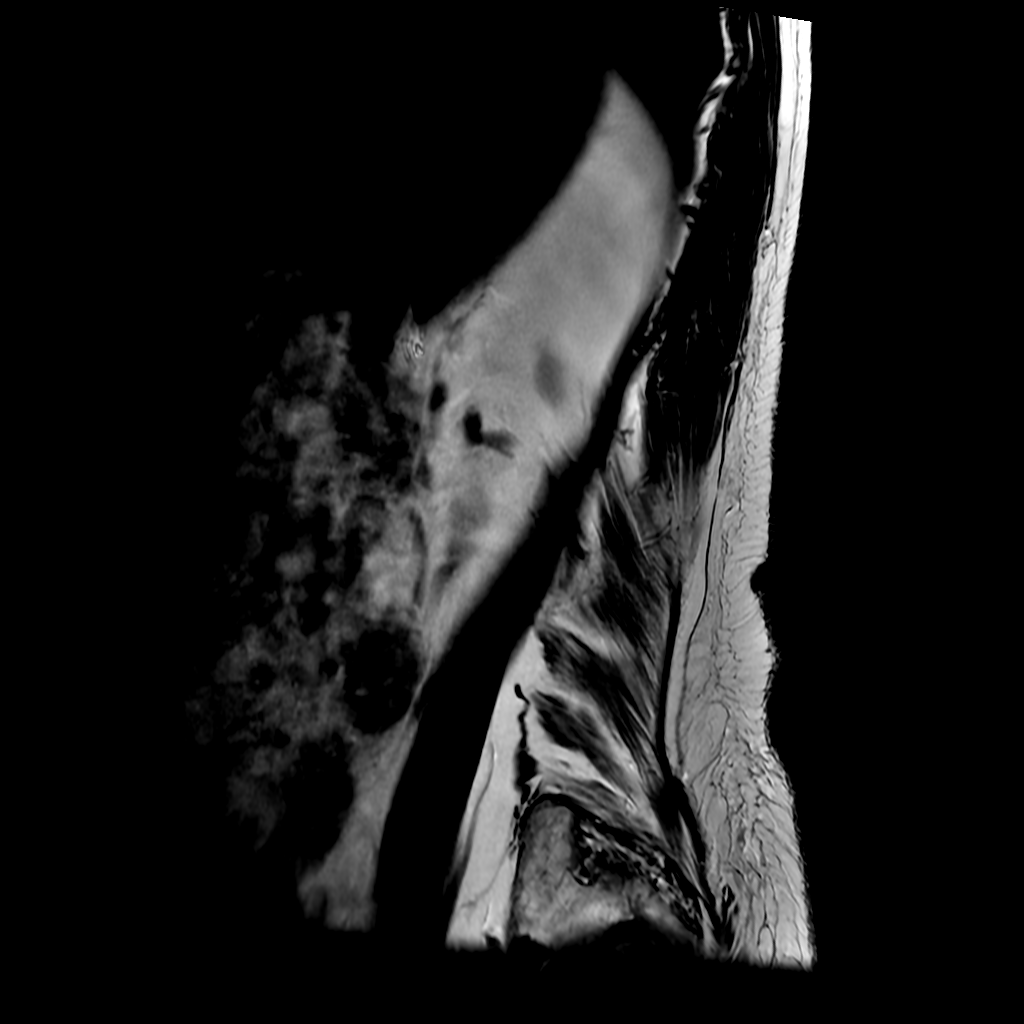
[im 12/18]
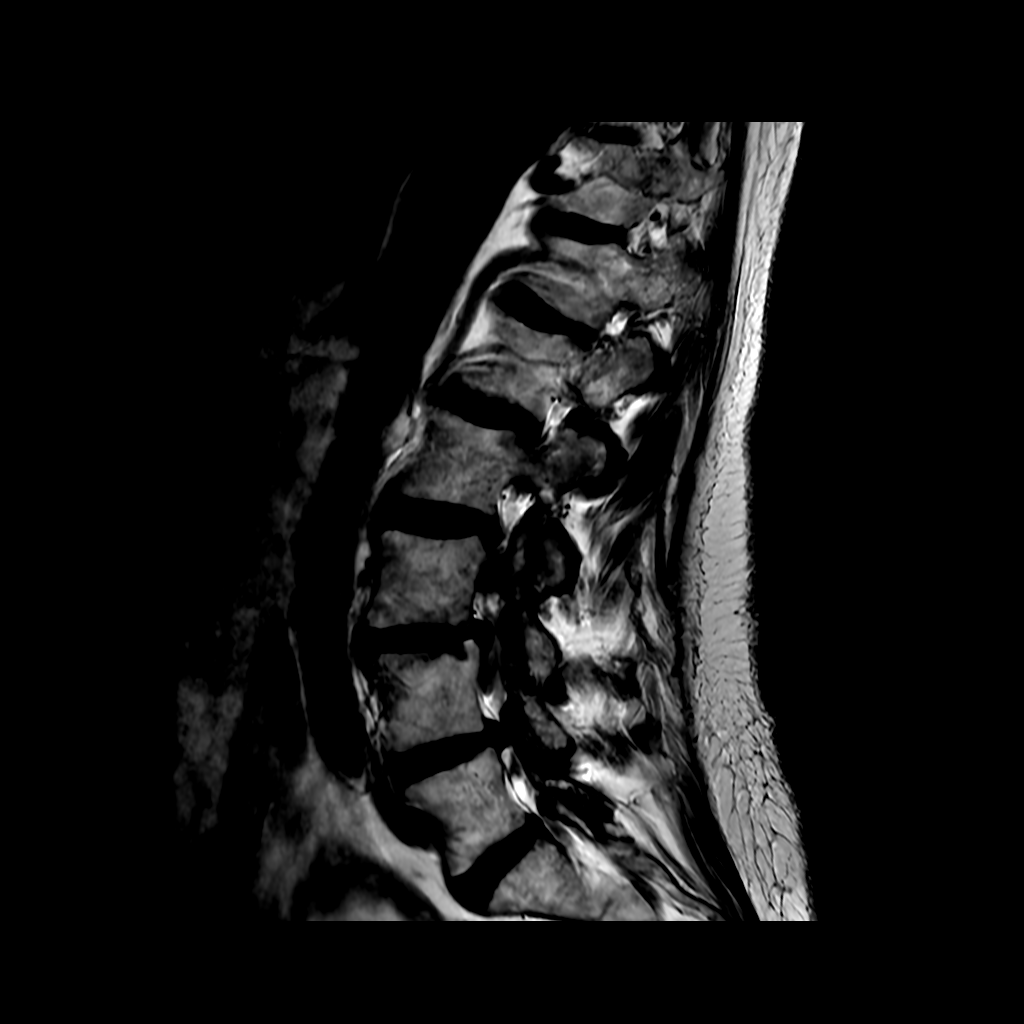
[im 18/18]
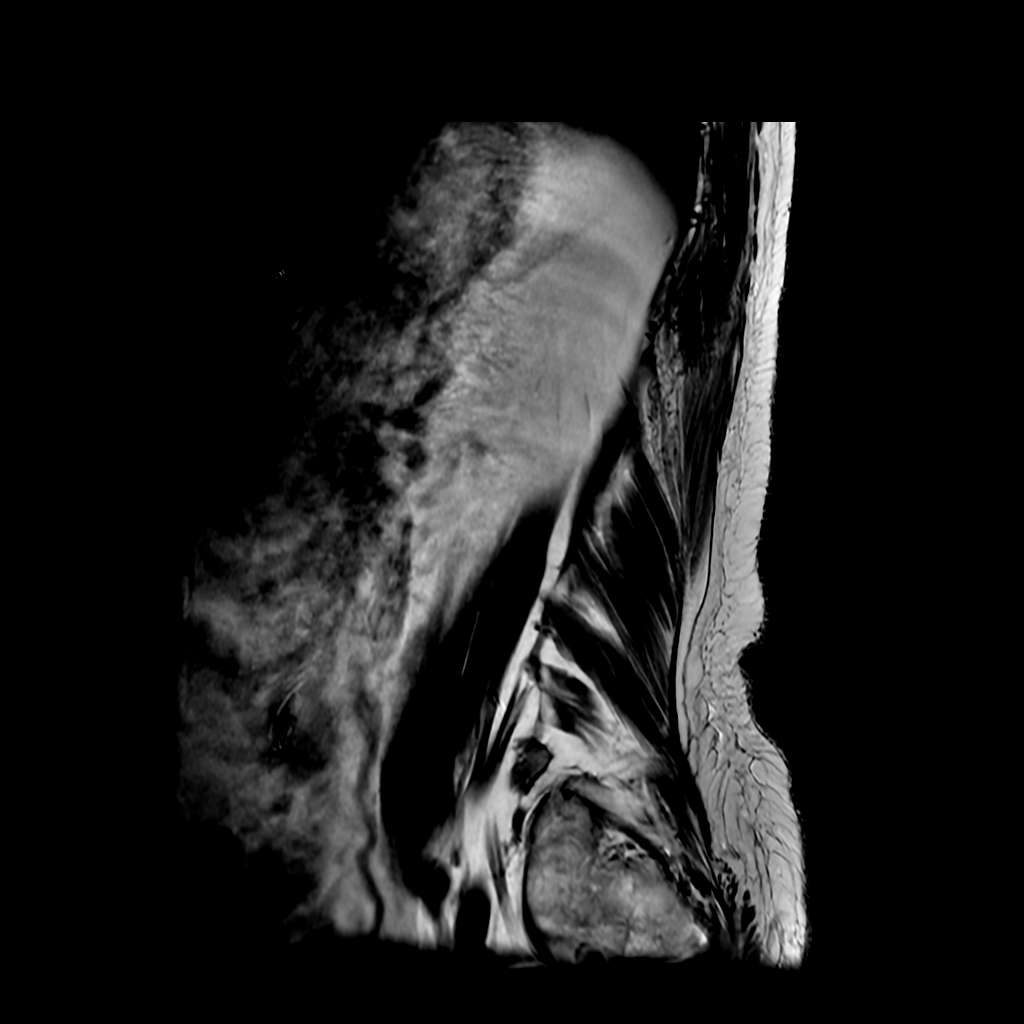

[Series 5: T2 · axial · 4.0mm · 0.18mm/px · 1 of 30 slices shown (2 of 2)]
[im 5/30]
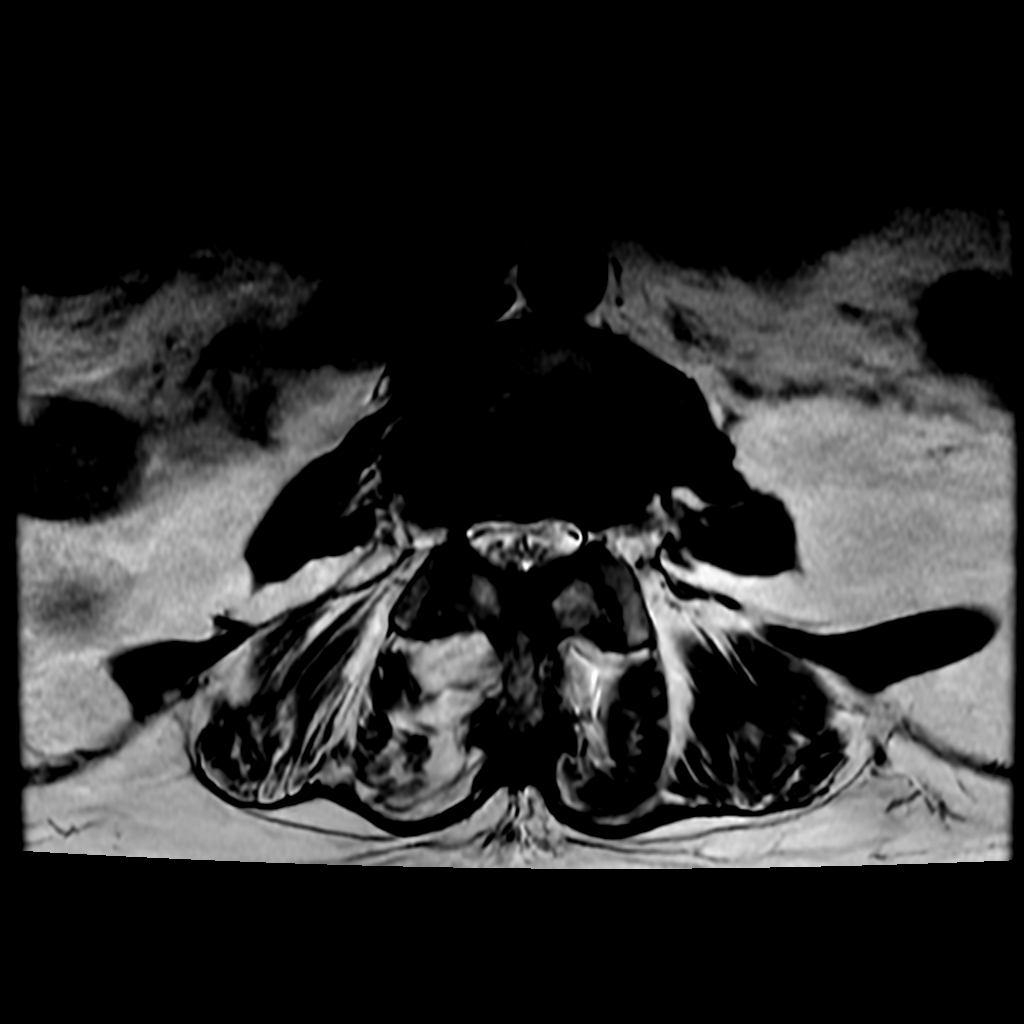

[4 of 48 positions shown; findings below may reference images not displayed]

FINDINGS: -------------------------------------------------------------------------------- 
------ 
GENERAL: 
Nomenclature is based on 5 lumbar type vertebral bodies.     
ALIGNMENT: Mild levoconvex lumbar scoliosis. Trace grade 1 retrolisthesis T12 on 
L1 and L1 on L2. There is trace grade 1 anterolisthesis L3 on L4. 
VERTEBRAL BODY HEIGHT: Mild chronic appearing inferior endplate compression 
deformity of T12. This is stable to the prior radiographs.  
MARROW SIGNAL: No focal suspect signal abnormality. 
CORD SIGNAL: Normal distal spinal cord and cauda equina. Conus medullaris 
terminates at L1. 
ADDITIONAL FINDINGS: There is prominence of the right renal pelvis and calyces. 
This could reflect right UPJ stenosis. No significant atrophy of either kidney, 
however. 
Delayed dilatation measures 11 mm, correlate with liver function tests. 
Modic I-II: None. 
Ligamentum Flavum > 2.5 mm: All levels. 
-------------------------------------------------------------------------------- 
------ 
SEGMENTAL: 
T12-L1: Loss of disc signal. Slight ballooning of the disc with Schmorls node. 
Retrolisthesis with disc unroofing. Canal and foramina are patent. Normal 
facets. 
L1-L2: Loss of disc signal with Schmorls node. Trace disc unroofing. Canal and 
foramina are patent. Normal facets. 
L2-L3: Mild loss of disc height posteriorly. Loss of disc signal. Minimal 
annular bulge. Canal and foramina are patent. Mild facet arthropathy. 
L3-L4: Moderate loss of disc height specifically to the right. Loss of disc 
signal. Schmorls nodes. Annular bulge with significant ligamentum flavum 
hypertrophy in moderate canal stenosis. Canal measures 4 mm in AP dimension. 
Mild left foraminal narrowing. Right foramen is patent. Right foraminal 
protrusion is noted which abuts the exiting right L3 nerve root but without 
nerve root distortion. Mild loss of disc height and signal. 
L4-L5: Moderately severe canal stenosis from combination of annular bulge, 
ligamentum flavum hypertrophy, and facet arthropathy. Lateral recess narrowing 
bilaterally. There is a component of disc extrusion in the central zone with 
disc material extending inferiorly. Cauda equina compression. Foramina patent. 
L5-S1: Loss of disc signal. Minimal annular bulge. This abuts the traversing S1 
nerve roots bilaterally but without nerve root distortion. Slight left lateral 
recess narrowing. Facet arthropathy with small bilateral facet joint effusions. 
Foramina patent. 
-------------------------------------------------------------------------------- 
------
IMPRESSION: Chronic appearing T12 compression deformity. 
Multilevel lumbar degenerative changes. Most significant canal stenosis is at 
L4-L5, moderately severe with cauda equina compression. 
There is also moderate canal stenosis L3-L4. 
Less significant degenerative changes detailed above. 
There is prominence of the right renal pelvis and calyces. This could reflect 
right UPJ stenosis.
